# Patient Record
Sex: Female | Born: 1990 | Race: White | Hispanic: No | Marital: Single | Smoking: Former smoker
Health system: Southern US, Community
[De-identification: ages and names within clinical notes are randomized; demographics above are authoritative.]

## PROBLEM LIST (undated history)

## (undated) ENCOUNTER — Inpatient Hospital Stay (HOSPITAL_COMMUNITY): Payer: Medicaid Other

## (undated) DIAGNOSIS — K219 Gastro-esophageal reflux disease without esophagitis: Secondary | ICD-10-CM

## (undated) DIAGNOSIS — D649 Anemia, unspecified: Secondary | ICD-10-CM

## (undated) DIAGNOSIS — F329 Major depressive disorder, single episode, unspecified: Secondary | ICD-10-CM

## (undated) DIAGNOSIS — F32A Depression, unspecified: Secondary | ICD-10-CM

## (undated) DIAGNOSIS — Z3490 Encounter for supervision of normal pregnancy, unspecified, unspecified trimester: Secondary | ICD-10-CM

## (undated) DIAGNOSIS — Z789 Other specified health status: Secondary | ICD-10-CM

---
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
## 2008-01-17 IMAGING — US US OB US >=[ID] SNGL FETUS
1 series · 17 of 28 positions shown · non-contrast
Comparison: none

REASON FOR EXAM: right lower quadrant pain
COMMENTS:

[Series 1: us ob us >=(id) sngl fetus · 17 of 46 slices shown]
[im 1/46]
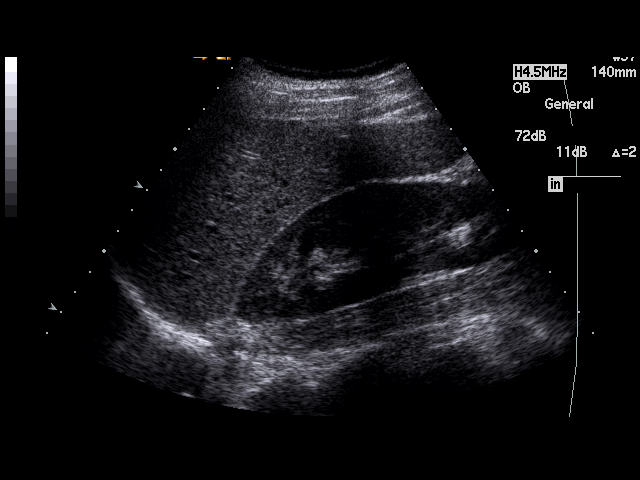
[im 4/46]
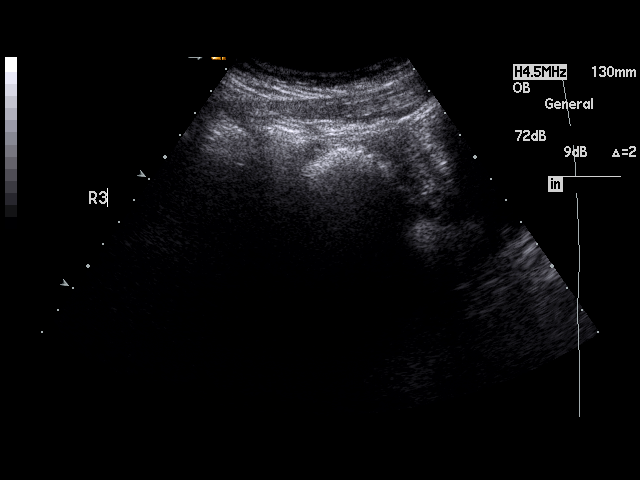
[im 7/46]
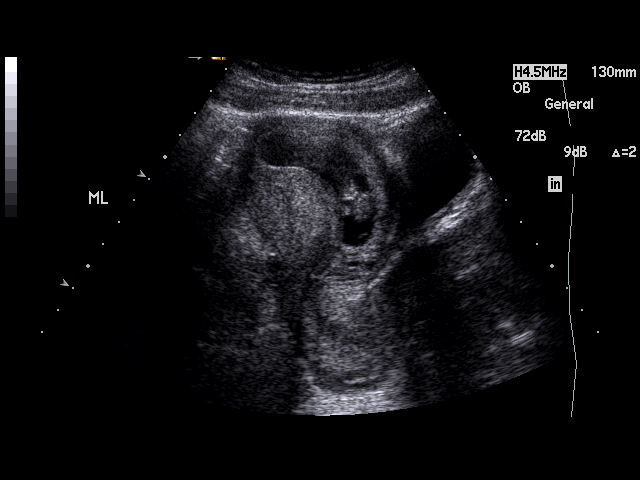
[im 9/46]
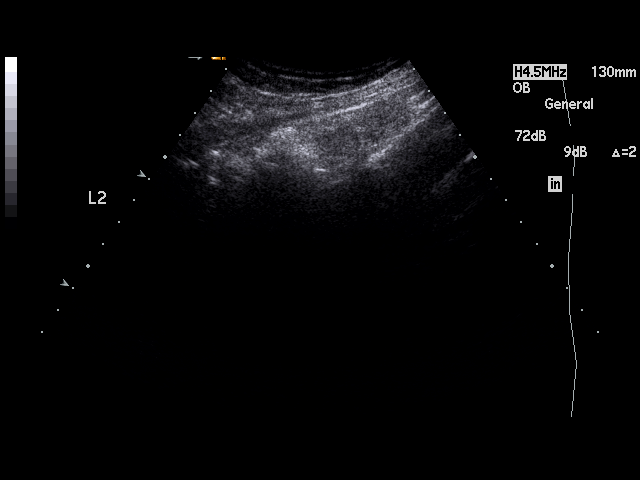
[im 12/46]
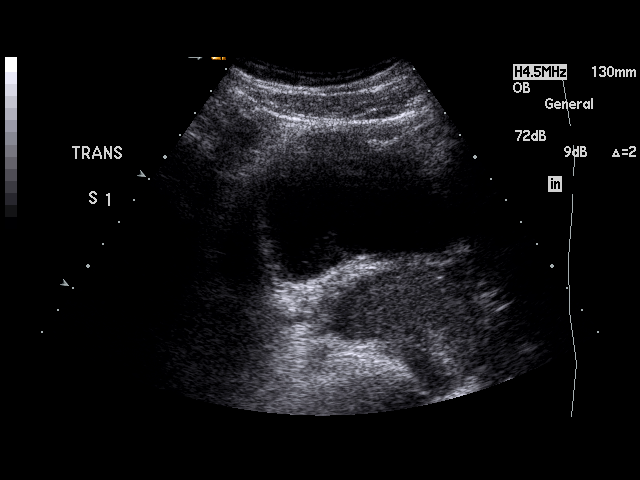
[im 16/46]
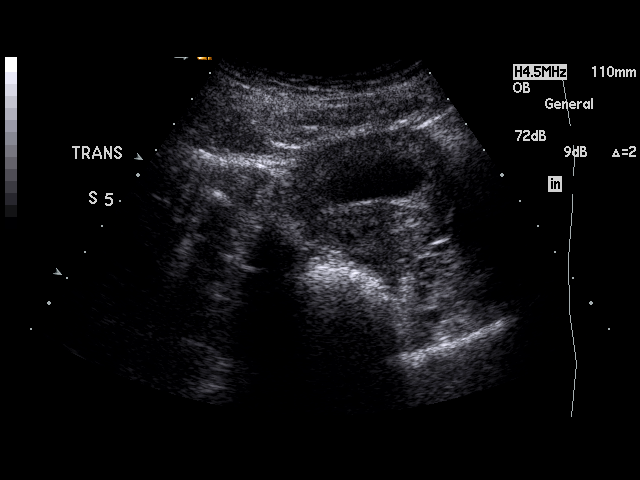
[im 17/46]
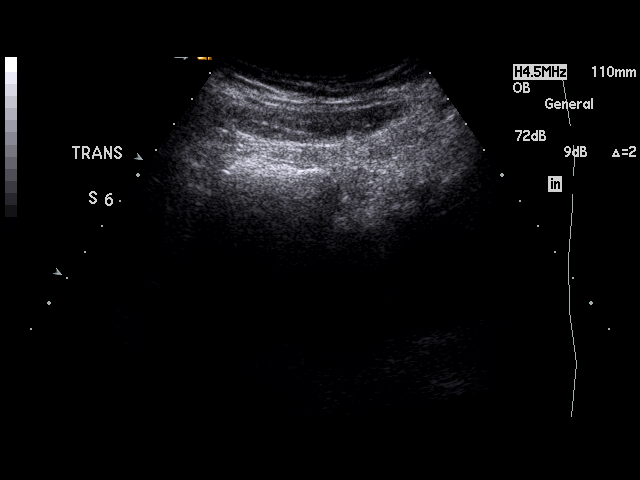
[im 21/46]
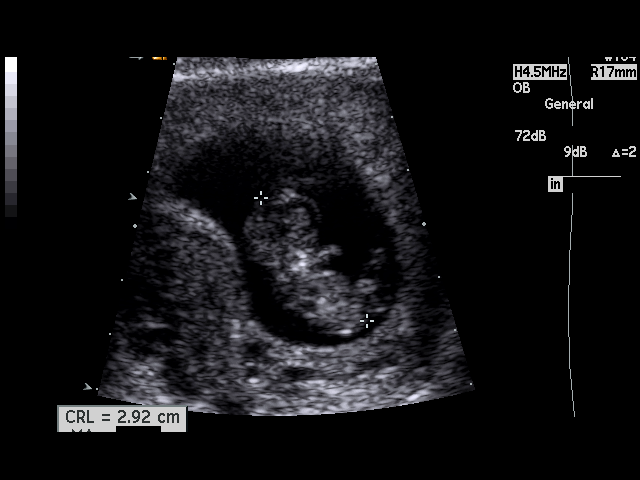
[im 24/46]
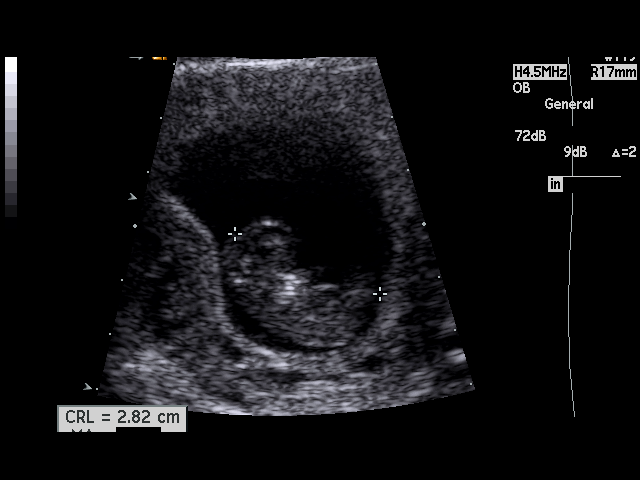
[im 26/46]
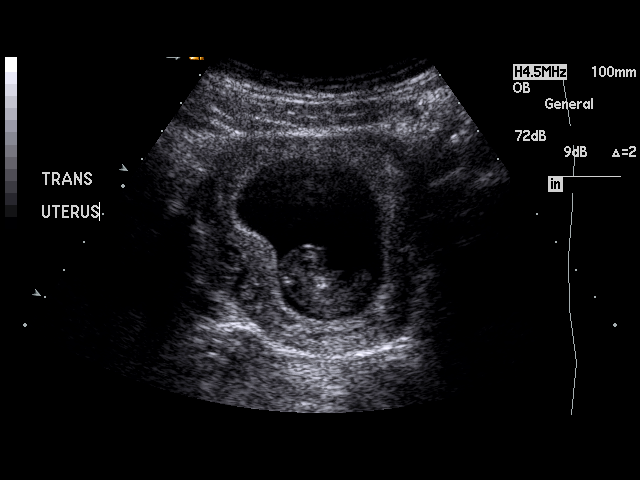
[im 29/46]
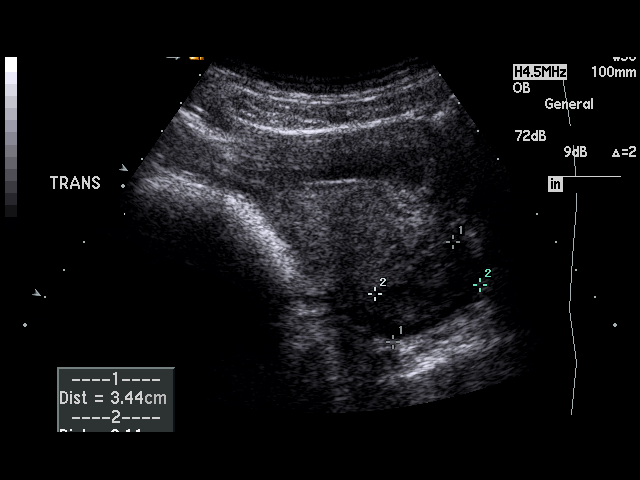
[im 31/46]
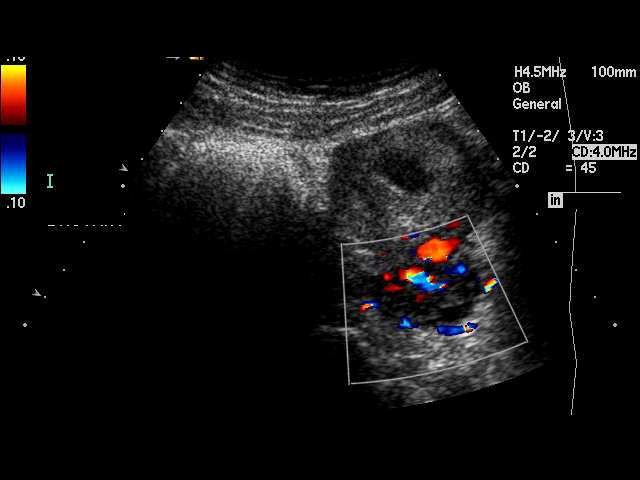
[im 34/46]
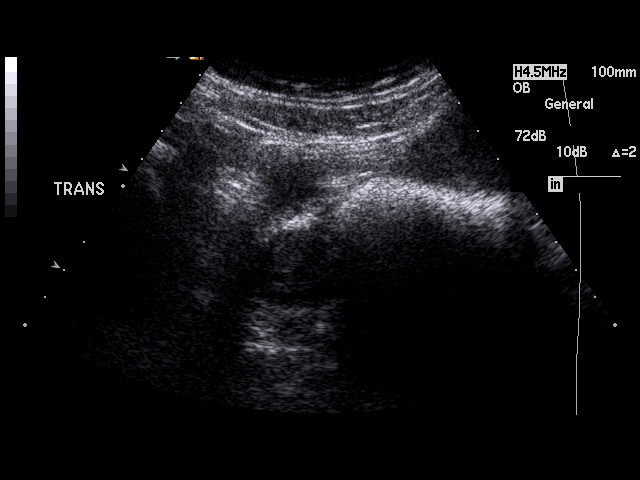
[im 37/46]
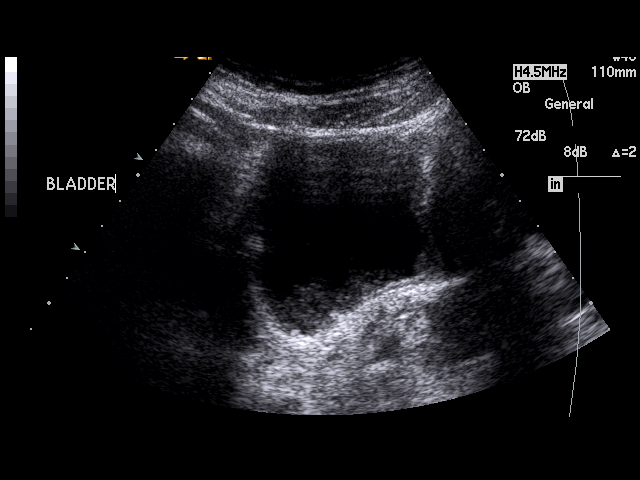
[im 39/46]
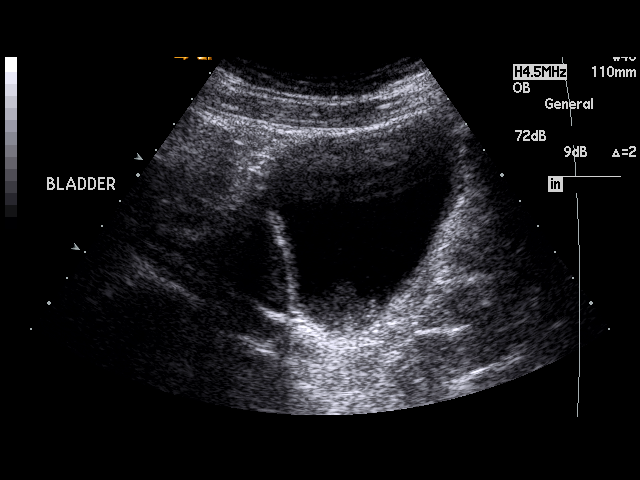
[im 42/46]
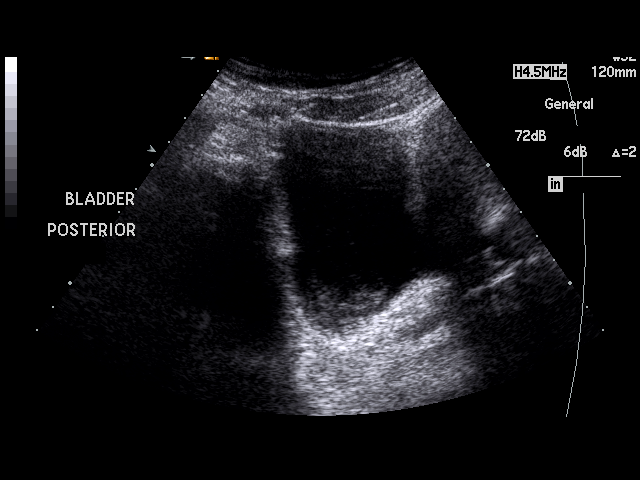
[im 46/46]
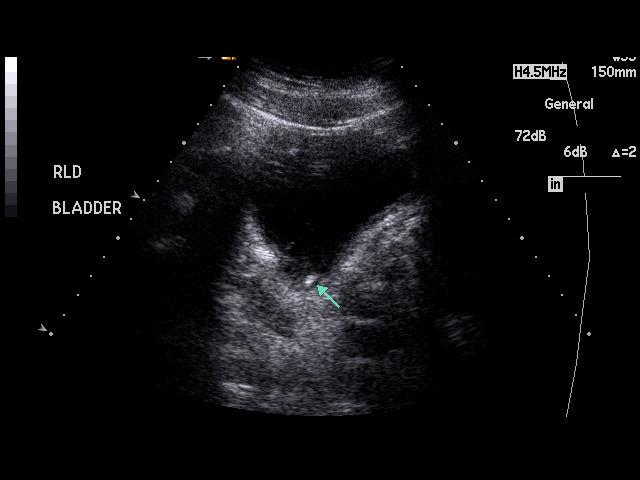

[17 of 28 positions shown; findings below may reference images not displayed]

PROCEDURE:     US  - US OB GREATER/OR EQUAL TO P75H2  - July 12, 2006 [DATE]

RESULT:     A single viable intrauterine pregnancy is appreciated with
estimated fetal heart rate of 171 beats per minute.  Crown rump length is
measured at 2.86 cm and corresponds to an estimated gestational age of 9
weeks-0 days.  The RIGHT ovary demonstrates a homogenous echotexture and
measures 2.94 x 1.79 cm.  The LEFT ovary also demonstrates a homogenous
echotexture and measures 3.4 x 3.1 cm.  There does not appear to be evidence
of pelvic masses, free fluid or drainable loculated fluid collections.

The urinary bladder is partially distended with urine and demonstrates
dependent echogenic material as well as small echogenic foci and projects
along the posterior aspect of the bladder wall.
IMPRESSION: 1)Single viable intrauterine pregnancy with estimated gestational age of 9
weeks-0 days.

2)Echogenic debris within the dependent portion of the urinary bladder may
represent blood or proteinaceous material.  Findings also are suspicious for
small calculi within the bladder. These findings can be further evaluated
with direct visualization if clinically warranted.

Dr. Babikir of the Emergency Room was informed of these findings via a
preliminary faxed report on 07-13-06.

## 2011-12-11 ENCOUNTER — Emergency Department: Payer: Self-pay | Admitting: Emergency Medicine

## 2015-04-24 ENCOUNTER — Encounter: Payer: Self-pay | Admitting: *Deleted

## 2015-04-24 ENCOUNTER — Emergency Department
Admission: EM | Admit: 2015-04-24 | Discharge: 2015-04-24 | Disposition: A | Discharge status: Home / Self Care | Payer: No Typology Code available for payment source | Attending: Emergency Medicine | Admitting: Emergency Medicine

## 2015-04-24 ENCOUNTER — Emergency Department: Payer: No Typology Code available for payment source

## 2015-04-24 DIAGNOSIS — S199XXA Unspecified injury of neck, initial encounter: Secondary | ICD-10-CM | POA: Diagnosis present

## 2015-04-24 DIAGNOSIS — S161XXA Strain of muscle, fascia and tendon at neck level, initial encounter: Secondary | ICD-10-CM | POA: Diagnosis not present

## 2015-04-24 DIAGNOSIS — Y999 Unspecified external cause status: Secondary | ICD-10-CM | POA: Diagnosis not present

## 2015-04-24 DIAGNOSIS — M62838 Other muscle spasm: Secondary | ICD-10-CM

## 2015-04-24 DIAGNOSIS — Y9389 Activity, other specified: Secondary | ICD-10-CM | POA: Insufficient documentation

## 2015-04-24 DIAGNOSIS — Y92410 Unspecified street and highway as the place of occurrence of the external cause: Secondary | ICD-10-CM | POA: Diagnosis not present

## 2015-04-24 MED ORDER — CYCLOBENZAPRINE HCL 5 MG PO TABS: 5.0000 mg | ORAL_TABLET | Freq: Three times a day (TID) | ORAL | Status: DC | PRN

## 2015-04-24 MED ORDER — NAPROXEN 500 MG PO TABS: 500.0000 mg | ORAL_TABLET | Freq: Two times a day (BID) | ORAL | Status: DC
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
## 2015-04-24 NOTE — ED Notes (Signed)
Pt taken to xray 
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
## 2015-04-24 NOTE — Discharge Instructions (Signed)
Cervical Sprain A cervical sprain is when the tissues (ligaments) that hold the neck bones in place stretch or tear. HOME CARE   Put ice on the injured area.  Put ice in a plastic bag.  Place a towel between your skin and the bag.  Leave the ice on for 15-20 minutes, 3-4 times a day.  You may have been given a collar to wear. This collar keeps your neck from moving while you heal.  Do not take the collar off unless told by your doctor.  If you have long hair, keep it outside of the collar.  Ask your doctor before changing the position of your collar. You may need to change its position over time to make it more comfortable.  If you are allowed to take off the collar for cleaning or bathing, follow your doctor's instructions on how to do it safely.  Keep your collar clean by wiping it with mild soap and water. Dry it completely. If the collar has removable pads, remove them every 1-2 days to hand wash them with soap and water. Allow them to air dry. They should be dry before you wear them in the collar.  Do not drive while wearing the collar.  Only take medicine as told by your doctor.  Keep all doctor visits as told.  Keep all physical therapy visits as told.  Adjust your work station so that you have good posture while you work.  Avoid positions and activities that make your problems worse.  Warm up and stretch before being active. GET HELP IF:  Your pain is not controlled with medicine.  You cannot take less pain medicine over time as planned.  Your activity level does not improve as expected. GET HELP RIGHT AWAY IF:   You are bleeding.  Your stomach is upset.  You have an allergic reaction to your medicine.  You develop new problems that you cannot explain.  You lose feeling (become numb) or you cannot move any part of your body (paralysis).  You have tingling or weakness in any part of your body.  Your symptoms get worse. Symptoms include:  Pain,  soreness, stiffness, puffiness (swelling), or a burning feeling in your neck.  Pain when your neck is touched.  Shoulder or upper back pain.  Limited ability to move your neck.  Headache.  Dizziness.  Your hands or arms feel week, lose feeling, or tingle.  Muscle spasms.  Difficulty swallowing or chewing. MAKE SURE YOU:   Understand these instructions.  Will watch your condition.  Will get help right away if you are not doing well or get worse.   This information is not intended to replace advice given to you by your health care provider. Make sure you discuss any questions you have with your health care provider.   Document Released: 06/22/2007 Document Revised: 09/05/2012 Document Reviewed: 07/11/2012 Elsevier Interactive Patient Education 2016 Elsevier Inc.  Cryotherapy Cryotherapy is when you put ice on your injury. Ice helps lessen pain and puffiness (swelling) after an injury. Ice works the best when you start using it in the first 24 to 48 hours after an injury. HOME CARE  Put a dry or damp towel between the ice pack and your skin.  You may press gently on the ice pack.  Leave the ice on for no more than 10 to 20 minutes at a time.  Check your skin after 5 minutes to make sure your skin is okay.  Rest at least 20  minutes between ice pack uses.  Stop using ice when your skin loses feeling (numbness).  Do not use ice on someone who cannot tell you when it hurts. This includes small children and people with memory problems (dementia). GET HELP RIGHT AWAY IF:  You have white spots on your skin.  Your skin turns blue or pale.  Your skin feels waxy or hard.  Your puffiness gets worse. MAKE SURE YOU:   Understand these instructions.  Will watch your condition.  Will get help right away if you are not doing well or get worse.   This information is not intended to replace advice given to you by your health care provider. Make sure you discuss any  questions you have with your health care provider.   Document Released: 06/22/2007 Document Revised: 03/28/2011 Document Reviewed: 08/26/2010 Elsevier Interactive Patient Education 2016 Elsevier Inc.  Foot LockerHeat Therapy Heat therapy can help ease sore, stiff, injured, and tight muscles and joints. Heat relaxes your muscles, which may help ease your pain. Heat therapy should only be used on old, pre-existing, or long-lasting (chronic) injuries. Do not use heat therapy unless told by your doctor. HOW TO USE HEAT THERAPY There are several different kinds of heat therapy, including:  Moist heat pack.  Warm water bath.  Hot water bottle.  Electric heating pad.  Heated gel pack.  Heated wrap.  Electric heating pad. GENERAL HEAT THERAPY RECOMMENDATIONS   Do not sleep while using heat therapy. Only use heat therapy while you are awake.  Your skin may turn pink while using heat therapy. Do not use heat therapy if your skin turns red.  Do not use heat therapy if you have new pain.  High heat or long exposure to heat can cause burns. Be careful when using heat therapy to avoid burning your skin.  Do not use heat therapy on areas of your skin that are already irritated, such as with a rash or sunburn. GET HELP IF:   You have blisters, redness, swelling (puffiness), or numbness.  You have new pain.  Your pain is worse. MAKE SURE YOU:  Understand these instructions.  Will watch your condition.  Will get help right away if you are not doing well or get worse.   This information is not intended to replace advice given to you by your health care provider. Make sure you discuss any questions you have with your health care provider.   Document Released: 03/28/2011 Document Revised: 01/24/2014 Document Reviewed: 02/26/2013 Elsevier Interactive Patient Education 2016 [employer]  Tourist information centre managerMotor Vehicle Collision After a car crash (motor vehicle collision), it is normal to have bruises and sore  muscles. The first 24 hours usually feel the worst. After that, you will likely start to feel better each day. HOME CARE  Put ice on the injured area.  Put ice in a plastic bag.  Place a towel between your skin and the bag.  Leave the ice on for 15-20 minutes, 03-04 times a day.  Drink enough fluids to keep your pee (urine) clear or pale yellow.  Do not drink alcohol.  Take a warm shower or bath 1 or 2 times a day. This helps your sore muscles.  Return to activities as told by your doctor. Be careful when lifting. Lifting can make neck or back pain worse.  Only take medicine as told by your doctor. Do not use aspirin. GET HELP RIGHT AWAY IF:   Your arms or legs tingle, feel weak, or lose feeling (numbness).  You have headaches that do not get better with medicine.  You have neck pain, especially in the middle of the back of your neck.  You cannot control when you pee (urinate) or poop (bowel movement).  Pain is getting worse in any part of your body.  You are short of breath, dizzy, or pass out (faint).  You have chest pain.  You feel sick to your stomach (nauseous), throw up (vomit), or sweat.  You have belly (abdominal) pain that gets worse.  There is blood in your pee, poop, or throw up.  You have pain in your shoulder (shoulder strap areas).  Your problems are getting worse. MAKE SURE YOU:   Understand these instructions.  Will watch your condition.  Will get help right away if you are not doing well or get worse.   This information is not intended to replace advice given to you by your health care provider. Make sure you discuss any questions you have with your health care provider.   Document Released: 06/22/2007 Document Revised: 03/28/2011 Document Reviewed: 06/02/2010 Elsevier Interactive Patient Education [employer] Elsevier Inc.
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
## 2016-04-04 DIAGNOSIS — Z3201 Encounter for pregnancy test, result positive: Secondary | ICD-10-CM | POA: Diagnosis not present

## 2016-04-04 DIAGNOSIS — R112 Nausea with vomiting, unspecified: Secondary | ICD-10-CM | POA: Diagnosis not present

## 2016-04-26 ENCOUNTER — Other Ambulatory Visit: Payer: Self-pay | Admitting: Nurse Practitioner

## 2016-04-26 DIAGNOSIS — Z369 Encounter for antenatal screening, unspecified: Secondary | ICD-10-CM

## 2016-05-09 ENCOUNTER — Ambulatory Visit
Admission: RE | Admit: 2016-05-09 | Discharge: 2016-05-09 | Disposition: A | Discharge status: Home / Self Care | Payer: Medicaid Other | Source: Ambulatory Visit | Attending: Maternal and Fetal Medicine | Admitting: Maternal and Fetal Medicine

## 2016-05-09 VITALS — BP 119/56 | HR 102 | Temp 98.0°F | Resp 18 | Ht 69.5 in | Wt 152.0 lb

## 2016-05-09 DIAGNOSIS — O3482 Maternal care for other abnormalities of pelvic organs, second trimester: Secondary | ICD-10-CM | POA: Insufficient documentation

## 2016-05-09 DIAGNOSIS — Z369 Encounter for antenatal screening, unspecified: Secondary | ICD-10-CM

## 2016-05-09 DIAGNOSIS — Z3A12 12 weeks gestation of pregnancy: Secondary | ICD-10-CM | POA: Diagnosis not present

## 2016-05-09 HISTORY — DX: Other specified health status: Z78.9

## 2016-05-09 NOTE — Progress Notes (Addendum)
Referring physician: [facility] Department Length of Consultation: 40 minutes   Kelli Ramirez  was referred to [facility] for genetic counseling to review prenatal screening and testing options.  This note summarizes the information we discussed.    We offered the following routine screening tests for this pregnancy:  First trimester screening, which includes nuchal translucency ultrasound screen and first trimester maternal serum marker screening.  The nuchal translucency has approximately an 80% detection rate for Down syndrome and can be positive for other chromosome abnormalities as well as congenital heart defects.  When combined with a maternal serum marker screening, the detection rate is up to 90% for Down syndrome and up to 97% for trisomy 18.     Maternal serum marker screening, a blood test that measures pregnancy proteins, can provide risk assessments for Down syndrome, trisomy 18, and open neural tube defects (spina bifida, anencephaly). Because it does not directly examine the fetus, it cannot positively diagnose or rule out these problems.  Targeted ultrasound uses high frequency sound waves to create an image of the developing fetus.  An ultrasound is often recommended as a routine means of evaluating the pregnancy.  It is also used to screen for fetal anatomy problems (for example, a heart defect) that might be suggestive of a chromosomal or other abnormality.   Should these screening tests indicate an increased concern, then the following additional testing options would be offered:  The chorionic villus sampling procedure is available for first trimester chromosome analysis.  This involves the withdrawal of a small amount of chorionic villi (tissue from the developing placenta).  Risk of pregnancy loss is estimated to be approximately 1 in 200 to 1 in 100 (0.5 to 1%).  There is approximately a 1% (1 in 100) chance that the CVS chromosome  results will be unclear.  Chorionic villi cannot be tested for neural tube defects.     Amniocentesis involves the removal of a small amount of amniotic fluid from the sac surrounding the fetus with the use of a thin Ramirez inserted through the maternal abdomen and uterus.  Ultrasound guidance is used throughout the procedure.  Fetal cells from amniotic fluid are directly evaluated and > 99.5% of chromosome problems and > 98% of open neural tube defects can be detected. This procedure is generally performed after the 15th week of pregnancy.  The main risks to this procedure include complications leading to miscarriage in less than 1 in 200 cases (0.5%).  As another option for information if the pregnancy is suspected to be an an increased chance for certain chromosome conditions, we also reviewed the availability of cell free fetal DNA testing from maternal blood to determine whether or not the baby may have either Down syndrome, trisomy 5, or trisomy 45.  This test utilizes a maternal blood sample and DNA sequencing technology to isolate circulating cell free fetal DNA from maternal plasma.  The fetal DNA can then be analyzed for DNA sequences that are derived from the three most common chromosomes involved in aneuploidy, chromosomes 13, 18, and 21.  If the overall amount of DNA is greater than the expected level for any of these chromosomes, aneuploidy is suspected.  While we do not consider it a replacement for invasive testing and karyotype analysis, a negative result from this testing would be reassuring, though not a guarantee of a normal chromosome complement for the baby.  An abnormal result is certainly suggestive of an abnormal chromosome complement,  though we would still recommend CVS or amniocentesis to confirm any findings from this testing.  Cystic Fibrosis and Spinal Muscular Atrophy (SMA) screening were also discussed with the patient. Both conditions are recessive, which means that both  parents must be carriers in order to have a child with the disease.  Cystic fibrosis (CF) is one of the most common genetic conditions in persons of Caucasian ancestry.  This condition occurs in approximately 1 in 2,500 Caucasian persons and results in thickened secretions in the lungs, digestive, and reproductive systems.  For a baby to be at risk for having CF, both of the parents must be carriers for this condition.  Approximately 1 in 33 Caucasian persons is a carrier for CF.  Current carrier testing looks for the most common mutations in the gene for CF and can detect approximately 90% of carriers in the Caucasian population.  This means that the carrier screening can greatly reduce, but cannot eliminate, the chance for an individual to have a child with CF.  If an individual is found to be a carrier for CF, then carrier testing would be available for the partner. As part of [place]'s newborn screening profile, all babies born in the state of [place] will have a two-tier screening process.  Specimens are first tested to determine the concentration of immunoreactive trypsinogen (IRT).  The top 5% of specimens with the highest IRT values then undergo DNA testing using a panel of over 40 common CF mutations. SMA is a neurodegenerative disorder that leads to atrophy of skeletal muscle and overall weakness.  This condition is also more prevalent in the Caucasian population, with 1 in 40-1 in 60 persons being a carrier and 1 in 6,000-1 in 10,000 children being affected.  There are multiple forms of the disease, with some causing death in infancy to other forms with survival into adulthood.  The genetics of SMA is complex, but carrier screening can detect up to 95% of carriers in the Caucasian population.  Similar to CF, a negative result can greatly reduce, but cannot eliminate, the chance to have a child with SMA.  We obtained a detailed family history and pregnancy history.  Kelli Ramirez reported that  the father of the baby, Kelli Ramirez, has a nephew (his brother's son) with autism.  We talked about the fact that the cause for autism spectrum disorders is often not well understood, though there may be both genetic and environmental factors.  Autism may also be part of numerous genetic syndromes.  The most common condition we offer screening for related to this history is Fragile X syndrome, though this condition is inherited from a carrier mother and cannot be passed from father to son.  Because this nephew is related through only males, we did not proceed with Fragile X testing.  The patient also said that the mother of that child has a history of autism in her family.  If more is learned about the cause for the condition in this nephew, we are happy to review this further.  The remainder of the family history was reported to be unremarkable for birth defects, intellectual delays, recurrent pregnancy loss or known chromosome abnormalities.  Kelli Ramirez stated that this is her second pregnancy.  She reported no complications or exposures in this pregnancy that would be expected to increase the risk for birth defects.  After consideration of the options, Kelli Ramirez elected to proceed with first trimester screening and to decline CF and SMA carrier screening.  An ultrasound was performed at the time of the visit.  The gestational age was consistent with 12 weeks.  Fetal anatomy could not be assessed due to early gestational age.  Please refer to the ultrasound report for details of that study.  Kelli Ramirez was encouraged to call with questions or concerns.  We can be contacted at [phone].    Cherly Anderson, MS, CGC  I was immediately available and supervising. Camelia Phenes, MD [facility]

## 2016-05-12 ENCOUNTER — Telehealth: Payer: Self-pay | Admitting: Obstetrics and Gynecology

## 2016-05-12 NOTE — Telephone Encounter (Signed)
Kelli Ramirez  elected to undergo First Trimester screening on 05/09/2016.  To review, first trimester screening, includes nuchal translucency ultrasound screen and/or first trimester maternal serum marker screening.  The nuchal translucency has approximately an 80% detection rate for Down syndrome and can be positive for other chromosome abnormalities as well as heart defects.  When combined with a maternal serum marker screening, the detection rate is up to 90% for Down syndrome and up to 97% for trisomy 13 and 18.     The results of the First Trimester Nuchal Translucency and Biochemical Screening were within normal range.  The risk for Down syndrome is now estimated to be 1 in 3,102.  The risk for Trisomy 13/18 is less than 1 in 10,000.  Should more definitive information be desired, we would offer amniocentesis.  Because we do not yet know the effectiveness of combined first and second trimester screening, we do not recommend a maternal serum screen to assess the chance for chromosome conditions.  However, if screening for neural tube defects is desired, maternal serum screening for AFP only can be performed between 15 and [redacted] weeks gestation.     Kelli Anderson, MS, CGC

## 2016-06-06 ENCOUNTER — Telehealth: Payer: Self-pay | Admitting: Obstetrics and Gynecology

## 2016-06-16 ENCOUNTER — Other Ambulatory Visit: Payer: Self-pay | Admitting: *Deleted

## 2016-06-16 DIAGNOSIS — O99320 Drug use complicating pregnancy, unspecified trimester: Secondary | ICD-10-CM

## 2016-06-20 ENCOUNTER — Ambulatory Visit
Admission: RE | Admit: 2016-06-20 | Discharge: 2016-06-20 | Disposition: A | Discharge status: Home / Self Care | Payer: Medicaid Other | Source: Ambulatory Visit | Attending: Maternal & Fetal Medicine | Admitting: Maternal & Fetal Medicine

## 2016-06-20 DIAGNOSIS — O99320 Drug use complicating pregnancy, unspecified trimester: Secondary | ICD-10-CM

## 2016-10-14 DIAGNOSIS — O34211 Maternal care for low transverse scar from previous cesarean delivery: Secondary | ICD-10-CM | POA: Diagnosis not present

## 2016-10-14 DIAGNOSIS — Z3009 Encounter for other general counseling and advice on contraception: Secondary | ICD-10-CM | POA: Diagnosis not present

## 2016-10-14 NOTE — H&P (Signed)
Kelli Ramirez is a 26 y.o. female here for Rf by Rodman Comp, CNM for delivery plans . Pt prenatal records reiewed + drug use  Cocaine  And MJ . edc 11/17/16. Pt desires permanent BTL , medicaid consent signed   Past Medical History:  has a past medical history of Anemia, unspecified; Anxiety, unspecified; Depression, unspecified; Family history of child sexual abuse; History of cocaine use; and History of marijuana use.  Past Surgical History:  has a past surgical history that includes Cesarean section. Family History: family history is not on file. Social History:  reports that she has never smoked. She has never used smokeless tobacco. She reports that she does not drink alcohol or use drugs. OB/GYN History:          OB History    Gravida Para Term Preterm AB Living   SAB TAB Ectopic Molar Multiple Live Births             1      Allergies: has No Known Allergies. Medications:  Current Outpatient Prescriptions:  .  ferrous sulfate 325 (65 FE) MG tablet, Take 325 mg by mouth daily with breakfast., Disp: , Rfl:  .  prenatal vit-iron fum-folic ac (PRENAVITE) tablet, Take 1 tablet by mouth once daily., Disp: , Rfl:  .  sertraline (ZOLOFT) 50 MG tablet, Take 50 mg by mouth once daily., Disp: , Rfl:   Review of Systems: General:                      No fatigue or weight loss Eyes:                           No vision changes Ears:                            No hearing difficulty Respiratory:                No cough or shortness of breath Pulmonary:                  No asthma or shortness of breath Cardiovascular:           No chest pain, palpitations, dyspnea on exertion Gastrointestinal:          No abdominal bloating, chronic diarrhea, constipations, masses, pain or hematochezia Genitourinary:             No hematuria, dysuria, abnormal vaginal discharge, pelvic pain, Menometrorrhagia Lymphatic:                   No swollen lymph nodes Musculoskeletal:          No muscle weakness Neurologic:                  No extremity weakness, syncope, seizure disorder Psychiatric:                  No history of depression, delusions or suicidal/homicidal ideation    Exam:   Vitals:   10/14/16 1006  BP: 110/65  Pulse: 97    Body mass index is 23.96 kg/m.  WDWN white/  female in NAD   Lungs: CTA  CV : RRR without murmur   Neck:  no thyromegaly Abdomen: soft , no mass, normal active bowel sounds,  non-tender, no rebound tenderness, well healed pfannenstiel  incision    Impression:   The primary encounter diagnosis was Previous cesarean delivery affecting pregnancy. A diagnosis of General counseling and advice for contraceptive management was also pertinent to this visit.    Plan:   Schedule repeat LTCS + BTL on 11/10/16 Benefits and risks to surgery: The proposed benefit of the surgery has been discussed with the patient. The possible risks include, but are not limited to: organ injury to the bowel , bladder, ureters, and major blood vessels and nerves. There is a possibility of additional surgeries resulting from these injuries. There is also the risk of blood transfusion and the need to receive blood products during or after the procedure which may rarely lead to HIV or Hepatitis C infection. There is a risk of developing a deep venous thrombosis or a pulmonary embolism . There is the possibility of wound infection and also anesthetic complications, even the rare possibility of death. The patient understands these risks and wishes to proceed. All questions have been answered and the consent has been signed.    Return if symptoms worsen or fail to improve.  Vilma Prader, MD  10/14/16

## 2016-11-09 ENCOUNTER — Encounter
Admission: RE | Admit: 2016-11-09 | Discharge: 2016-11-09 | Disposition: A | Discharge status: Home / Self Care | Payer: Medicaid Other | Source: Ambulatory Visit | Attending: Obstetrics and Gynecology | Admitting: Obstetrics and Gynecology

## 2016-11-09 HISTORY — DX: Encounter for supervision of normal pregnancy, unspecified, unspecified trimester: Z34.90

## 2016-11-09 HISTORY — DX: Depression, unspecified: F32.A

## 2016-11-09 HISTORY — DX: Gastro-esophageal reflux disease without esophagitis: K21.9

## 2016-11-09 HISTORY — DX: Major depressive disorder, single episode, unspecified: F32.9

## 2016-11-09 HISTORY — DX: Anemia, unspecified: D64.9

## 2016-11-09 LAB — BASIC METABOLIC PANEL
Anion gap: 6 (ref 5–15)
BUN: 9 mg/dL (ref 6–20)
CHLORIDE: 106 mmol/L (ref 101–111)
CO2: 25 mmol/L (ref 22–32)
Calcium: 8.8 mg/dL — ABNORMAL LOW (ref 8.9–10.3)
Creatinine, Ser: 0.7 mg/dL (ref 0.44–1.00)
GFR calc Af Amer: >60 mL/min (ref >60)
GFR calc non Af Amer: >60 mL/min (ref >60)
GLUCOSE: 96 mg/dL (ref 65–99)
POTASSIUM: 3.5 mmol/L (ref 3.5–5.1)
Sodium: 137 mmol/L (ref 135–145)

## 2016-11-09 LAB — CBC
HEMATOCRIT: 31.2 % — AB (ref 35.0–47.0)
Hemoglobin: 11 g/dL — ABNORMAL LOW (ref 12.0–16.0)
MCH: 33.2 pg (ref 26.0–34.0)
MCHC: 35.3 g/dL (ref 32.0–36.0)
MCV: 94.1 fL (ref 80.0–100.0)
PLATELETS: 171 10*3/uL (ref 150–440)
RBC: 3.32 MIL/uL — ABNORMAL LOW (ref 3.80–5.20)
RDW: 13.2 % (ref 11.5–14.5)
WBC: 8.3 10*3/uL (ref 3.6–11.0)

## 2016-11-09 LAB — TYPE AND SCREEN
ABO/RH(D): O POS
Antibody Screen: NEGATIVE
EXTEND SAMPLE REASON: UNDETERMINED

## 2016-11-09 NOTE — Pre-Procedure Instructions (Signed)
Patient was unaware of the time change for her surgery. She will need to get her transportation rearranged for earlier morning.

## 2016-11-09 NOTE — Patient Instructions (Addendum)
PRE OPERATIVE INSTRUCTIONS FOR C-SECTION:  Nothing to eat after midnight tonight.  This includes gum, candy, lozengers.  You may drink clear liquids up until 2 hours before your scheduled arrival time.  Use the Chlorhexadine soap wash tonight and tomorrow morning. Refer to the sheet, "PREPARING FOR SURGERY".      Use as directed and explained by Pre-Admit Testing Nurse.   No jewelry, no valuables, no need for license, credit card or insurance documents.  Do not use creams or lotions once shower with soap.    Bring comfortable clothing/pajamas to wear in the hospital.    If your water breaks tonight, cramping becomes regular contractions or if bleeding begins, then call Dr. Feliberto GottronSchermerhorn or come to the Emergency Room.   ARRIVE AT THE BIRTHPLACE AT 8:00AM FOR SURGERY AT 10:00AM

## 2016-11-10 ENCOUNTER — Inpatient Hospital Stay: Payer: Medicaid Other | Admitting: Certified Registered Nurse Anesthetist

## 2016-11-10 ENCOUNTER — Encounter
Admission: RE | Disposition: A | Discharge status: Home / Self Care | Payer: Self-pay | Source: Ambulatory Visit | Attending: Obstetrics and Gynecology

## 2016-11-10 ENCOUNTER — Inpatient Hospital Stay
Admission: RE | Admit: 2016-11-10 | Discharge: 2016-11-13 | DRG: 784 | Disposition: A | Discharge status: Home / Self Care | Payer: Medicaid Other | Source: Ambulatory Visit | Attending: Obstetrics and Gynecology | Admitting: Obstetrics and Gynecology

## 2016-11-10 DIAGNOSIS — O9081 Anemia of the puerperium: Secondary | ICD-10-CM | POA: Diagnosis not present

## 2016-11-10 DIAGNOSIS — K219 Gastro-esophageal reflux disease without esophagitis: Secondary | ICD-10-CM | POA: Diagnosis present

## 2016-11-10 DIAGNOSIS — O9962 Diseases of the digestive system complicating childbirth: Secondary | ICD-10-CM | POA: Diagnosis present

## 2016-11-10 DIAGNOSIS — Z3A39 39 weeks gestation of pregnancy: Secondary | ICD-10-CM

## 2016-11-10 DIAGNOSIS — F1290 Cannabis use, unspecified, uncomplicated: Secondary | ICD-10-CM | POA: Diagnosis present

## 2016-11-10 DIAGNOSIS — F419 Anxiety disorder, unspecified: Secondary | ICD-10-CM | POA: Diagnosis present

## 2016-11-10 DIAGNOSIS — O34211 Maternal care for low transverse scar from previous cesarean delivery: Principal | ICD-10-CM | POA: Diagnosis present

## 2016-11-10 DIAGNOSIS — O99344 Other mental disorders complicating childbirth: Secondary | ICD-10-CM | POA: Diagnosis present

## 2016-11-10 DIAGNOSIS — F329 Major depressive disorder, single episode, unspecified: Secondary | ICD-10-CM | POA: Diagnosis present

## 2016-11-10 DIAGNOSIS — F1490 Cocaine use, unspecified, uncomplicated: Secondary | ICD-10-CM | POA: Diagnosis present

## 2016-11-10 DIAGNOSIS — D62 Acute posthemorrhagic anemia: Secondary | ICD-10-CM | POA: Diagnosis not present

## 2016-11-10 DIAGNOSIS — O99324 Drug use complicating childbirth: Secondary | ICD-10-CM | POA: Diagnosis present

## 2016-11-10 DIAGNOSIS — Z302 Encounter for sterilization: Secondary | ICD-10-CM

## 2016-11-10 DIAGNOSIS — Z9889 Other specified postprocedural states: Secondary | ICD-10-CM

## 2016-11-10 LAB — URINE DRUG SCREEN, QUALITATIVE (ARMC ONLY)
Amphetamines, Ur Screen: NOT DETECTED
BARBITURATES, UR SCREEN: NOT DETECTED
Benzodiazepine, Ur Scrn: NOT DETECTED
CANNABINOID 50 NG, UR ~~LOC~~: NOT DETECTED
Cocaine Metabolite,Ur ~~LOC~~: NOT DETECTED
MDMA (ECSTASY) UR SCREEN: NOT DETECTED
Methadone Scn, Ur: NOT DETECTED
Opiate, Ur Screen: NOT DETECTED
Phencyclidine (PCP) Ur S: NOT DETECTED
TRICYCLIC, UR SCREEN: NOT DETECTED

## 2016-11-10 LAB — ABO/RH: ABO/RH(D): O POS

## 2016-11-10 SURGERY — Surgical Case
Anesthesia: Spinal | Laterality: Bilateral

## 2016-11-10 MED ORDER — DIPHENHYDRAMINE HCL 25 MG PO CAPS: 25.0000 mg | ORAL_CAPSULE | Freq: Four times a day (QID) | ORAL | Status: DC | PRN

## 2016-11-10 MED ORDER — SENNOSIDES-DOCUSATE SODIUM 8.6-50 MG PO TABS
2.0000 | ORAL_TABLET | ORAL | Status: DC
  Administered 2016-11-10 – 2016-11-13 (×3): 2 via ORAL
  Filled 2016-11-10 (×4): qty 2

## 2016-11-10 MED ORDER — DIBUCAINE 1 % RE OINT: 1.0000 "application " | TOPICAL_OINTMENT | RECTAL | Status: DC | PRN

## 2016-11-10 MED ORDER — SODIUM CHLORIDE FLUSH 0.9 % IV SOLN
INTRAVENOUS | Status: AC
  Filled 2016-11-10: qty 10

## 2016-11-10 MED ORDER — OXYCODONE-ACETAMINOPHEN 5-325 MG PO TABS
2.0000 | ORAL_TABLET | ORAL | Status: DC | PRN
  Administered 2016-11-11 – 2016-11-13 (×4): 2 via ORAL
  Filled 2016-11-10 (×5): qty 2

## 2016-11-10 MED ORDER — PRENATAL MULTIVITAMIN CH
1.0000 | ORAL_TABLET | Freq: Every day | ORAL | Status: DC
  Administered 2016-11-11 – 2016-11-13 (×3): 1 via ORAL
  Filled 2016-11-10 (×3): qty 1

## 2016-11-10 MED ORDER — TETANUS-DIPHTH-ACELL PERTUSSIS 5-2.5-18.5 LF-MCG/0.5 IM SUSP: 0.5000 mL | Freq: Once | INTRAMUSCULAR | Status: DC

## 2016-11-10 MED ORDER — MORPHINE SULFATE (PF) 0.5 MG/ML IJ SOLN
INTRAMUSCULAR | Status: AC
  Filled 2016-11-10: qty 10

## 2016-11-10 MED ORDER — CEFAZOLIN SODIUM-DEXTROSE 2-4 GM/100ML-% IV SOLN: 2.0000 g | INTRAVENOUS | Status: DC

## 2016-11-10 MED ORDER — DEXTROSE 5 % IV SOLN
2.0000 g | Freq: Once | INTRAVENOUS | Status: AC
  Administered 2016-11-10: 2 g via INTRAVENOUS
  Filled 2016-11-10: qty 2000

## 2016-11-10 MED ORDER — OXYTOCIN 40 UNITS IN LACTATED RINGERS INFUSION - SIMPLE MED
2.5000 [IU]/h | INTRAVENOUS | Status: AC
  Administered 2016-11-10: 2.5 [IU]/h via INTRAVENOUS
  Filled 2016-11-10 (×2): qty 1000

## 2016-11-10 MED ORDER — SODIUM CHLORIDE 0.9 % IJ SOLN
INTRAMUSCULAR | Status: AC
  Filled 2016-11-10: qty 50

## 2016-11-10 MED ORDER — OXYTOCIN 40 UNITS IN LACTATED RINGERS INFUSION - SIMPLE MED
INTRAVENOUS | Status: DC | PRN
  Administered 2016-11-10: 800 mL via INTRAVENOUS

## 2016-11-10 MED ORDER — PROMETHAZINE HCL 25 MG/ML IJ SOLN
INTRAMUSCULAR | Status: AC
  Filled 2016-11-10: qty 1

## 2016-11-10 MED ORDER — OXYCODONE-ACETAMINOPHEN 5-325 MG PO TABS
1.0000 | ORAL_TABLET | ORAL | Status: DC | PRN
  Administered 2016-11-13: 1 via ORAL
  Filled 2016-11-10: qty 1

## 2016-11-10 MED ORDER — EPHEDRINE SULFATE 50 MG/ML IJ SOLN
INTRAMUSCULAR | Status: DC | PRN
  Administered 2016-11-10: 10 mg via INTRAVENOUS
  Administered 2016-11-10 (×5): 5 mg via INTRAVENOUS

## 2016-11-10 MED ORDER — LACTATED RINGERS IV SOLN: INTRAVENOUS | Status: DC

## 2016-11-10 MED ORDER — SIMETHICONE 80 MG PO CHEW: 80.0000 mg | CHEWABLE_TABLET | ORAL | Status: DC | PRN

## 2016-11-10 MED ORDER — WITCH HAZEL-GLYCERIN EX PADS: 1.0000 "application " | MEDICATED_PAD | CUTANEOUS | Status: DC | PRN

## 2016-11-10 MED ORDER — MENTHOL 3 MG MT LOZG
1.0000 | LOZENGE | OROMUCOSAL | Status: DC | PRN
  Filled 2016-11-10: qty 9

## 2016-11-10 MED ORDER — BUPIVACAINE LIPOSOME 1.3 % IJ SUSP: 20.0000 mL | Freq: Once | INTRAMUSCULAR | Status: DC

## 2016-11-10 MED ORDER — SIMETHICONE 80 MG PO CHEW
80.0000 mg | CHEWABLE_TABLET | ORAL | Status: DC
  Administered 2016-11-10 – 2016-11-13 (×3): 80 mg via ORAL
  Filled 2016-11-10 (×3): qty 1

## 2016-11-10 MED ORDER — BUPIVACAINE HCL (PF) 0.5 % IJ SOLN
INTRAMUSCULAR | Status: AC
  Filled 2016-11-10: qty 30

## 2016-11-10 MED ORDER — ONDANSETRON HCL 4 MG/2ML IJ SOLN
4.0000 mg | Freq: Three times a day (TID) | INTRAMUSCULAR | Status: DC | PRN
  Administered 2016-11-10: 4 mg via INTRAVENOUS
  Filled 2016-11-10: qty 2

## 2016-11-10 MED ORDER — FENTANYL CITRATE (PF) 100 MCG/2ML IJ SOLN
INTRAMUSCULAR | Status: AC
  Filled 2016-11-10: qty 2

## 2016-11-10 MED ORDER — DEXAMETHASONE SODIUM PHOSPHATE 10 MG/ML IJ SOLN
10.0000 mg | Freq: Once | INTRAMUSCULAR | Status: DC | PRN
  Filled 2016-11-10: qty 1

## 2016-11-10 MED ORDER — LACTATED RINGERS IV BOLUS (SEPSIS)
1000.0000 mL | Freq: Once | INTRAVENOUS | Status: AC
  Administered 2016-11-10: 1000 mL via INTRAVENOUS

## 2016-11-10 MED ORDER — BUPIVACAINE LIPOSOME 1.3 % IJ SUSP
INTRAMUSCULAR | Status: DC | PRN
  Administered 2016-11-10: 90 mL

## 2016-11-10 MED ORDER — PROMETHAZINE HCL 25 MG/ML IJ SOLN
25.0000 mg | Freq: Three times a day (TID) | INTRAMUSCULAR | Status: DC | PRN
  Filled 2016-11-10: qty 1

## 2016-11-10 MED ORDER — PROMETHAZINE HCL 25 MG/ML IJ SOLN
25.0000 mg | Freq: Three times a day (TID) | INTRAMUSCULAR | Status: DC | PRN
  Administered 2016-11-10: 25 mg via INTRAMUSCULAR

## 2016-11-10 MED ORDER — SODIUM CHLORIDE 0.9 % IV SOLN
INTRAVENOUS | Status: DC | PRN
  Administered 2016-11-10: 35 ug/min via INTRAVENOUS

## 2016-11-10 MED ORDER — SOD CITRATE-CITRIC ACID 500-334 MG/5ML PO SOLN
ORAL | Status: AC
  Administered 2016-11-10: 30 mL
  Filled 2016-11-10: qty 15

## 2016-11-10 MED ORDER — BUPIVACAINE IN DEXTROSE 0.75-8.25 % IT SOLN
INTRATHECAL | Status: DC | PRN
  Administered 2016-11-10: 1.6 mL via INTRATHECAL

## 2016-11-10 MED ORDER — NALBUPHINE HCL 10 MG/ML IJ SOLN: 5.0000 mg | Freq: Four times a day (QID) | INTRAMUSCULAR | Status: DC | PRN

## 2016-11-10 MED ORDER — OXYTOCIN 40 UNITS IN LACTATED RINGERS INFUSION - SIMPLE MED
INTRAVENOUS | Status: AC
  Filled 2016-11-10: qty 1000

## 2016-11-10 MED ORDER — FENTANYL CITRATE (PF) 100 MCG/2ML IJ SOLN
INTRAMUSCULAR | Status: DC | PRN
  Administered 2016-11-10: 20 ug via INTRATHECAL

## 2016-11-10 MED ORDER — COCONUT OIL OIL: 1.0000 "application " | TOPICAL_OIL | Status: DC | PRN

## 2016-11-10 MED ORDER — LACTATED RINGERS IV SOLN
INTRAVENOUS | Status: DC
  Administered 2016-11-10: 09:00:00 via INTRAVENOUS

## 2016-11-10 MED ORDER — ZOLPIDEM TARTRATE 5 MG PO TABS: 5.0000 mg | ORAL_TABLET | Freq: Every evening | ORAL | Status: DC | PRN

## 2016-11-10 MED ORDER — IBUPROFEN 600 MG PO TABS
600.0000 mg | ORAL_TABLET | Freq: Four times a day (QID) | ORAL | Status: DC
  Administered 2016-11-10 – 2016-11-13 (×11): 600 mg via ORAL
  Filled 2016-11-10 (×11): qty 1

## 2016-11-10 MED ORDER — NALBUPHINE HCL 10 MG/ML IJ SOLN
5.0000 mg | Freq: Four times a day (QID) | INTRAMUSCULAR | Status: DC | PRN
  Administered 2016-11-11: 5 mg via INTRAVENOUS
  Filled 2016-11-10: qty 1

## 2016-11-10 MED ORDER — PROMETHAZINE HCL 25 MG/ML IJ SOLN
12.5000 mg | INTRAMUSCULAR | Status: AC
  Administered 2016-11-10: 12.5 mg via INTRAVENOUS

## 2016-11-10 MED ORDER — LACTATED RINGERS IV BOLUS (SEPSIS)
500.0000 mL | Freq: Once | INTRAVENOUS | Status: AC
  Administered 2016-11-10: 500 mL via INTRAVENOUS

## 2016-11-10 MED ORDER — MORPHINE SULFATE (PF) 0.5 MG/ML IJ SOLN
INTRAMUSCULAR | Status: DC | PRN
  Administered 2016-11-10: .2 mg via INTRATHECAL

## 2016-11-10 MED ORDER — SIMETHICONE 80 MG PO CHEW
80.0000 mg | CHEWABLE_TABLET | Freq: Three times a day (TID) | ORAL | Status: DC
  Administered 2016-11-11 – 2016-11-13 (×8): 80 mg via ORAL
  Filled 2016-11-10 (×8): qty 1

## 2016-11-10 MED ORDER — BUPIVACAINE LIPOSOME 1.3 % IJ SUSP
INTRAMUSCULAR | Status: AC
  Filled 2016-11-10: qty 20

## 2016-11-10 MED ORDER — ONDANSETRON HCL 4 MG/2ML IJ SOLN
INTRAMUSCULAR | Status: DC | PRN
  Administered 2016-11-10: 4 mg via INTRAVENOUS

## 2016-11-10 MED ORDER — ACETAMINOPHEN 325 MG PO TABS
650.0000 mg | ORAL_TABLET | ORAL | Status: DC | PRN
  Administered 2016-11-11: 650 mg via ORAL
  Filled 2016-11-10: qty 2

## 2016-11-10 SURGICAL SUPPLY — 23 items
BARRIER ADHS 3X4 INTERCEED (GAUZE/BANDAGES/DRESSINGS) ×2 IMPLANT
CANISTER SUCT 3000ML PPV (MISCELLANEOUS) ×2 IMPLANT
CHLORAPREP W/TINT 26ML (MISCELLANEOUS) ×2 IMPLANT
DRSG TELFA 3X8 NADH (GAUZE/BANDAGES/DRESSINGS) ×2 IMPLANT
ELECT CAUTERY BLADE 6.4 (BLADE) ×2 IMPLANT
ELECT REM PT RETURN 9FT ADLT (ELECTROSURGICAL) ×2
ELECTRODE REM PT RTRN 9FT ADLT (ELECTROSURGICAL) ×1 IMPLANT
GAUZE SPONGE 4X4 12PLY STRL (GAUZE/BANDAGES/DRESSINGS) ×2 IMPLANT
GLOVE BIO SURGEON STRL SZ8 (GLOVE) ×2 IMPLANT
GOWN STRL REUS W/ TWL LRG LVL3 (GOWN DISPOSABLE) ×2 IMPLANT
GOWN STRL REUS W/ TWL XL LVL3 (GOWN DISPOSABLE) ×1 IMPLANT
GOWN STRL REUS W/TWL LRG LVL3 (GOWN DISPOSABLE) ×2
GOWN STRL REUS W/TWL XL LVL3 (GOWN DISPOSABLE) ×1
NS IRRIG 1000ML POUR BTL (IV SOLUTION) ×2 IMPLANT
PACK C SECTION AR (MISCELLANEOUS) ×2 IMPLANT
PAD OB MATERNITY 4.3X12.25 (PERSONAL CARE ITEMS) ×2 IMPLANT
PAD PREP 24X41 OB/GYN DISP (PERSONAL CARE ITEMS) ×2 IMPLANT
STAPLER INSORB 30 2030 C-SECTI (MISCELLANEOUS) ×2 IMPLANT
STRAP SAFETY BODY (MISCELLANEOUS) ×2 IMPLANT
SUCT VACUUM KIWI BELL (SUCTIONS) ×2 IMPLANT
SUT CHROMIC 1 CTX 36 (SUTURE) ×6 IMPLANT
SUT PLAIN GUT 0 (SUTURE) ×4 IMPLANT
SUT VIC AB 0 CT1 36 (SUTURE) ×4 IMPLANT

## 2016-11-10 NOTE — Anesthesia Procedure Notes (Signed)
Procedure Name: MAC Performed by: Demetrius Charity Pre-anesthesia Checklist: Patient identified, Emergency Drugs available, Patient being monitored, Suction available and Timeout performed Oxygen Delivery Method: Nasal cannula

## 2016-11-10 NOTE — Op Note (Signed)
NAMEMarland Kitchen  SANDER, SPECKMAN NO.:  [id number]  MEDICAL RECORD NO.:  [id number]  LOCATION:                                 FACILITY:  PHYSICIAN:  Jennell Corner, MD     DATE OF BIRTH:  DATE OF PROCEDURE:  11/10/2016 DATE OF DISCHARGE:                              OPERATIVE REPORT   PREOPERATIVE DIAGNOSIS: 1. A 39+0 weeks' estimated gestational age. 2. Previous cesarean section, electing for repeat cesarean section. 3. Elective permanent sterilization.  POSTOPERATIVE DIAGNOSIS: 1. A 39+0 weeks' estimated gestational age. 2. Previous cesarean section, electing for repeat cesarean section. 3. Elective permanent sterilization.  PROCEDURE PERFORMED: 1. Repeat low transverse cesarean section. 2. Bilateral tubal ligation, [place].  SURGEON:  Jennell Corner, MD  ANESTHESIA:  Spinal.  FIRST ASSISTANT:  Scrub tech.  INDICATIONS:  A 26 year old gravida 2, para 1 patient with a previous cesarean section.  The patient is decided on repeat cesarean section and permanent sterilization.  Medicaid consent signed.  The patient reconfirms desire to have her sterilization the day of the procedure.  DESCRIPTION OF PROCEDURE:  After adequate spinal anesthesia, the patient was placed in dorsal supine position with hip rolled on the right side. The patient's abdomen was prepped and draped in normal sterile fashion. The patient did receive 2 g IV Ancef prior to commencement of the case. Time-out was performed.  A Pfannenstiel incision was made 2 fingerbreadths above the symphysis pubis.  Sharp dissection was used to identify the fascia.  Fascia was opened in the midline and opened in a transverse fashion.  The superior aspect of the fascia was grasped with Kocher clamps and the recti muscles were dissected free.  Inferior aspect of the fascia was grasped with Kocher clamps and pyramidalis muscles dissected free.  Entry into the peritoneal cavity was accomplished sharply.   The vesicouterine peritoneal fold was identified and opened and bladder flap was created, and the bladder was reflected inferiorly.  Low transverse uterine incision was made upon entry into the endometrial cavity.  Clear fluid resulted.  Fetal head was brought to the incision and a kiwi vacuum was applied to the occiput with 1 gentle pull, the head was delivered.  The vacuum was removed and the shoulders and the body were delivered without difficulty.  Vigorous female was dried on the abdomen for the next 60 seconds with a delayed cord clamping at 60 seconds.  Apgars were assigned by neonatal staff assigned Apgars of 9 and 9.  Weight 6 pounds 9 ounces.  Time of birth is 11:13 a.m. on November 10, 2016.  The placenta was manually delivered. The uterus was exteriorized and the endometrial cavity was wiped clean with laparotomy tape.  Ring forceps was used to open the cervix and this was passed off the operative field.  Uterine incision was closed with 1 chromic suture in a running locking fashion.  Good approximation of edges.  Good hemostasis noted.  Attention was directed to the patient's right fallopian tube, which was grasped with the midportion.  Two separate 0 plain gut sutures were applied to the fallopian tube and a 1.5 cm portion of fallopian tube was removed, good hemostasis was  noted. Similar procedure was repeated on the patient's left fallopian tube. Again, 2-0 plain gut sutures were placed and a 1.5 cm portion of fallopian tube removed.  Posterior cul-de-sac was irrigated and suctioned and the uterus was placed back in the abdominal cavity.  The pericolic gutters were wiped clean with laparotomy tapes.  Uterine incision appeared hemostatic and Interceed was placed over the uterine incision in a T-shaped fashion.  The fascia was then closed with 0 Vicryl suture in a running nonlocking fashion, good approximation of edges.  The fascial edge was then injected with a solution of 20  mL of 1.3% bupivacaine and 30 mL of 0.5% Marcaine and 50 mL of saline, 60 mL were used to inject the fascial edge.  Subcutaneous tissues were irrigated and bovied for hemostasis and the skin was reapproximated with Insorb absorbable staples.  Additional 30 mL of Exparel solution was injected in the skin edges.  There were no complications.  ESTIMATED BLOOD LOSS:  500 mL.  INTRAOPERATIVE FLUIDS:  1490 mL of Exparel solution used.  The patient tolerated the procedure well and was taken to recovery room in good condition.    ______________________________ Jennell Cornerhomas Schermerhorn, MD   ______________________________ Jennell Cornerhomas Schermerhorn, MD    TS/MEDQ  D:  11/10/2016  T:  11/10/2016  Job:  [id number]

## 2016-11-10 NOTE — Anesthesia Preprocedure Evaluation (Signed)
Anesthesia Evaluation  Patient identified by MRN, date of birth, ID band Patient awake    Reviewed: Allergy & Precautions, H&P , NPO status , Patient's Chart, lab work & pertinent test results, reviewed documented beta blocker date and time   Airway Mallampati: II   Neck ROM: full    Dental  (+) Poor Dentition   Pulmonary neg pulmonary ROS, former smoker,    Pulmonary exam normal        Cardiovascular negative cardio ROS Normal cardiovascular exam Rhythm:regular Rate:Normal     Neuro/Psych negative neurological ROS  negative psych ROS   GI/Hepatic negative GI ROS, Neg liver ROS, GERD  Medicated,  Endo/Other  negative endocrine ROS  Renal/GU negative Renal ROS  negative genitourinary   Musculoskeletal   Abdominal   Peds  Hematology negative hematology ROS (+) anemia ,   Anesthesia Other Findings Past Medical History: No date: Anemia     Comment:  takes iron supplements No date: Depression No date: GERD (gastroesophageal reflux disease)     Comment:  with pregnancy No date: Medical history non-contributory No date: Pregnancy     Comment:  edc 11/17/2016.  c/section planned for 11/10/2016 Past Surgical History: 2009: CESAREAN SECTION BMI    Body Mass Index:  23.10 kg/m     Reproductive/Obstetrics negative OB ROS                             Anesthesia Physical Anesthesia Plan  ASA: II  Anesthesia Plan: Spinal   Post-op Pain Management:    Induction:   PONV Risk Score and Plan:   Airway Management Planned:   Additional Equipment:   Intra-op Plan:   Post-operative Plan:   Informed Consent: I have reviewed the patients History and Physical, chart, labs and discussed the procedure including the risks, benefits and alternatives for the proposed anesthesia with the patient or authorized representative who has indicated his/her understanding and acceptance.   Dental  Advisory Given  Plan Discussed with: CRNA  Anesthesia Plan Comments:         Anesthesia Quick Evaluation

## 2016-11-10 NOTE — Progress Notes (Signed)
MD A at bedside

## 2016-11-10 NOTE — Anesthesia Post-op Follow-up Note (Signed)
Anesthesia QCDR form completed.        

## 2016-11-10 NOTE — Anesthesia Procedure Notes (Signed)
Spinal  Patient location during procedure: OR Staffing Performed: anesthesiologist  Preanesthetic Checklist Completed: patient identified, site marked, surgical consent, pre-op evaluation, timeout performed, IV checked, risks and benefits discussed and monitors and equipment checked Spinal Block Patient position: sitting Prep: Betadine Patient monitoring: heart rate, continuous pulse ox, blood pressure and cardiac monitor Approach: midline Location: L4-5 Injection technique: single-shot Needle Needle type: Whitacre and Introducer  Needle gauge: 24 G Needle length: 9 cm Assessment Sensory level: T4 Additional Notes Negative paresthesia. Negative blood return. Positive free-flowing CSF. Expiration date of kit checked and confirmed. Patient tolerated procedure well, without complications.       

## 2016-11-10 NOTE — Progress Notes (Signed)
Called Dr. Noralyn Pickarroll, MDA and OR nurse, Lupita Leashonna, and asked for an estimated time until staff could attend scheduled c/s. Was told it would be at least 30 minutes. MD and nursery nurse notified.

## 2016-11-10 NOTE — Progress Notes (Signed)
Ready for LTCS and BTL . All questions answered  NPO  Labs reviewed  proceed

## 2016-11-10 NOTE — Brief Op Note (Signed)
11/10/2016  11:47 AM  PATIENT:  Kelli Ramirez  26 y.o. female  PRE-OPERATIVE DIAGNOSIS:  repeat cesarean section, sterilization 39+[redacted] weeks EGA   POST-OPERATIVE DIAGNOSIS:  repeat cesarean section, sterilization  PROCEDURE:  Procedure(s) with comments: CESAREAN SECTION WITH BILATERAL TUBAL LIGATION (Bilateral) - birth [id number]  SURGEON:  Surgeon(s) and Role:    * Schermerhorn, Ihor Austinhomas J, MD - Primary  PHYSICIAN ASSISTANT:scrub tech   ASSISTANTS: none   ANESTHESIA:   spinal  EBL:  500 mL   BLOOD ADMINISTERED:none  DRAINS: Urinary Catheter (Foley)   LOCAL MEDICATIONS USED:  MARCAINE    and BUPIVICAINE   SPECIMEN:  Source of Specimen:  portion right and left fallopian tube   DISPOSITION OF SPECIMEN:  PATHOLOGY  COUNTS:  YES  TOURNIQUET:  * No tourniquets in log *  DICTATION: .Other Dictation: Dictation Number verbal  PLAN OF CARE: Admit to inpatient   PATIENT DISPOSITION:  PACU - hemodynamically stable.   Delay start of Pharmacological VTE agent (>24hrs) due to surgical blood loss or risk of bleeding: not applicable

## 2016-11-10 NOTE — Transfer of Care (Signed)
Immediate Anesthesia Transfer of Care Note  Patient: Kelli Ramirez  Procedure(s) Performed: CESAREAN SECTION WITH BILATERAL TUBAL LIGATION (Bilateral )  Patient Location: PACU  Anesthesia Type:General  Level of Consciousness: awake, alert  and oriented  Airway & Oxygen Therapy: Patient Spontanous Breathing  Post-op Assessment: Report given to RN and Post -op Vital signs reviewed and stable  Post vital signs: Reviewed and stable  Last Vitals:  Vitals:   11/10/16 0837 11/10/16 1154  BP:  120/66  Pulse:    Resp:  12  Temp: 36.6 C (!) 36.3 C  SpO2:  98%    Last Pain:  Vitals:   11/10/16 0837  TempSrc: Oral  PainSc:          Complications: No apparent anesthesia complications

## 2016-11-11 LAB — CBC
HEMATOCRIT: 25.3 % — AB (ref 35.0–47.0)
HEMOGLOBIN: 8.9 g/dL — AB (ref 12.0–16.0)
MCH: 33 pg (ref 26.0–34.0)
MCHC: 35.2 g/dL (ref 32.0–36.0)
MCV: 93.7 fL (ref 80.0–100.0)
PLATELETS: 154 10*3/uL (ref 150–440)
RBC: 2.7 MIL/uL — AB (ref 3.80–5.20)
RDW: 13.3 % (ref 11.5–14.5)
WBC: 8.5 10*3/uL (ref 3.6–11.0)

## 2016-11-11 LAB — SURGICAL PATHOLOGY

## 2016-11-11 MED ORDER — FERROUS SULFATE 325 (65 FE) MG PO TABS
325.0000 mg | ORAL_TABLET | Freq: Two times a day (BID) | ORAL | Status: DC
  Administered 2016-11-11 – 2016-11-13 (×4): 325 mg via ORAL
  Filled 2016-11-11 (×4): qty 1

## 2016-11-11 NOTE — Progress Notes (Signed)
Subjective: Postpartum Day : Cesarean Delivery Patient reports nausea, incisional pain and tolerating PO.  Nausea controlled with IM phenergine and iv zofran yesterday, was intermittent.  Objective: Vital signs in last 24 hours: Temp:  [97.3 F (36.3 C)-98.7 F (37.1 C)] 98 F (36.7 C) (10/26 0422) Pulse Rate:  [68-100] 77 (10/26 0422) Resp:  [12-24] 20 (10/26 0422) BP: (93-128)/(53-87) 101/57 (10/26 0422) SpO2:  [97 %-100 %] 99 % (10/26 0422)  Physical Exam:  General: alert, cooperative and appears stated age 3Lochia: appropriate Uterine Fundus: firm Incision: healing well, no significant drainage, no dehiscence, no significant erythema DVT Evaluation: No evidence of DVT seen on physical exam.   Recent Labs  11/09/16 1353 11/11/16 0512  HGB 11.0* 8.9*  HCT 31.2* 25.3*    Assessment/Plan: Status post Cesarean section. Postoperative course complicated by acute blood loss anemia: On po iron, asx  Continue current care.  Christeen DouglasBethany Stacyann Mcconaughy 11/11/2016, 9:00 AM

## 2016-11-11 NOTE — Anesthesia Postprocedure Evaluation (Signed)
Anesthesia Post Note  Patient: Kelli Ramirez  Procedure(s) Performed: CESAREAN SECTION WITH BILATERAL TUBAL LIGATION (Bilateral )  Patient location during evaluation: PACU Anesthesia Type: Spinal Level of consciousness: oriented and awake and alert Pain management: pain level controlled Vital Signs Assessment: post-procedure vital signs reviewed and stable Respiratory status: spontaneous breathing, respiratory function stable and patient connected to nasal cannula oxygen Cardiovascular status: blood pressure returned to baseline and stable Postop Assessment: no headache, no backache and no apparent nausea or vomiting Anesthetic complications: no     Last Vitals:  Vitals:   11/10/16 2321 11/11/16 0422  BP: 103/66 (!) 101/57  Pulse: 81 77  Resp: 20 20  Temp: 36.8 C 36.7 C  SpO2: 99% 99%    Last Pain:  Vitals:   11/11/16 0422  TempSrc: Oral  PainSc:                  Kelli Ramirez,  Kelli Ramirez

## 2016-11-11 NOTE — Clinical Social Work Maternal (Signed)
CLINICAL SOCIAL WORK MATERNAL/CHILD NOTE  Patient Details  Name: Kelli Ramirez MRN: [id number] Date of Birth: April 12, 1990  Date:  11/11/2016  Clinical Social Worker Initiating Note:  York SpanielMonica Cotton Beckley MSW,LCSW Date/Time: Initiated:  11/11/16/      Child's Name:      Biological Parents:  Mother   Need for Interpreter:  None   Reason for Referral:  Current Substance Use/Substance Use During Pregnancy    Address:  [address] [place] [address]    Phone number:  [phone] (home)     Additional phone number: none  Household Members/Support Persons (HM/SP):       HM/SP Name Relationship DOB or Age  HM/SP -1        HM/SP -2        HM/SP -3        HM/SP -4        HM/SP -5        HM/SP -6        HM/SP -7        HM/SP -8          Natural Supports (not living in the home):  Parent   Professional Supports: None   Employment: Unemployed   Type of Work:     Education:      Homebound arranged:    Surveyor, quantityinancial Resources:  Medicaid   Other Resources:  [employer]IC, Sales executiveood Stamps    Cultural/Religious Considerations Which May Impact Care:  none  Strengths:  Ability to meet basic needs , Home prepared for child , Understanding of illness   Psychotropic Medications:         Pediatrician:       [place]    [place]    [place]      Pediatrician Fax Number:    Risk Factors/Current Problems:  Substance Use    Cognitive State:  Alert , Able to Concentrate    Mood/Affect:  Calm , Bright , Relaxed    CSW Assessment: CSW informed by nursing that patient was positive for marijuana and cocaine at the beginning of her pregnancy. Patient's drug screen is negative for illicit substances. Patient's newborn's cord tissue is pending. CSW spoke with patient and explained role and purpose of visit. Patient was pleasant and cooperative. Patient states she lives with her  boyfriend/father of baby  And their 28 year old son. 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 She initially used marijuana for the anxiety but when she found out she was pregnant, she spoke with doctor and the doctor began zoloft 25 mg BID. Patient states that the cocaine use was intermittent prior to her pregnancy and did not use it but one time at the beginning and she did not know she was pregnant. Patient states she is not going to restart using and does not need rehab resources. CSW explained that if her newborn's cord tissue returned positive for illicit substances, that a DSS CPS report would be made. Patient verbalized understanding. Patient verbalized as well that she received education on postpartum depression and the signs and symptoms.   As a side note, patient's zoloft dose was a very low dose but she states she took it all throughout her pregnancy.  CSW spoke with nursing to see if the physician is going to taper her off the zoloft rather than abruptly stopping it.   CSW Plan/Description:  CSW Will Continue to Monitor Umbilical Cord Tissue Drug Screen Results and Make Report if [facility], LCSW 11/11/2016, 10:13 AM

## 2016-11-12 MED ORDER — NICOTINE 7 MG/24HR TD PT24
7.0000 mg | MEDICATED_PATCH | Freq: Every day | TRANSDERMAL | Status: DC
  Administered 2016-11-12 – 2016-11-13 (×2): 7 mg via TRANSDERMAL
  Filled 2016-11-12 (×2): qty 1

## 2016-11-12 NOTE — Progress Notes (Signed)
Subjective: Postpartum Day 2 : Cesarean Delivery Patient reports nausea, incisional pain and tolerating PO. Improving daily  Objective: Vital signs in last 24 hours: Temp:  [97.7 F (36.5 C)-98.5 F (36.9 C)] 97.7 F (36.5 C) (10/27 0847) Pulse Rate:  [86-95] 95 (10/27 0847) Resp:  [16-18] 18 (10/26 2028) BP: (92-115)/(56-63) 92/58 (10/27 0847) SpO2:  [98 %-99 %] 99 % (10/27 0847)  Physical Exam:  General: alert, cooperative and appears stated age Lochia: appropriate Uterine Fundus: firm Incision: healing well, no significant drainage, no dehiscence, no significant erythema DVT Evaluation: No evidence of DVT seen on physical exam.   Recent Labs  11/09/16 1353 11/11/16 0512  HGB 11.0* 8.9*  HCT 31.2* 25.3*    Assessment/Plan: Status post Cesarean section. Postoperative course complicated by acute blood loss anemia: On po iron, asx  Continue current care.  Kelli Ramirez 11/12/2016, 9:48 AM

## 2016-11-13 MED ORDER — OXYCODONE HCL 5 MG PO TABS: 5.0000 mg | ORAL_TABLET | ORAL | 0 refills | Status: AC | PRN

## 2016-11-13 MED ORDER — IBUPROFEN 600 MG PO TABS: 600.0000 mg | ORAL_TABLET | Freq: Four times a day (QID) | ORAL | 1 refills | Status: AC
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
## 2016-11-13 NOTE — Progress Notes (Signed)
Patient discharged home with infant and significant other. Discharge instructions, prescriptions, hygiene kit and follow up appointment given to and reviewed with patient and significant other. Patient verbalized understanding. Escorted out via wheelchair by nursing staff.  

## 2016-11-17 NOTE — Telephone Encounter (Signed)
No further documentation needed. Closing encounter. 

## 2017-07-17 DIAGNOSIS — Z5181 Encounter for therapeutic drug level monitoring: Secondary | ICD-10-CM | POA: Diagnosis not present

## 2017-07-19 DIAGNOSIS — Z5181 Encounter for therapeutic drug level monitoring: Secondary | ICD-10-CM | POA: Diagnosis not present

## 2017-07-24 DIAGNOSIS — Z5181 Encounter for therapeutic drug level monitoring: Secondary | ICD-10-CM | POA: Diagnosis not present

## 2017-07-26 DIAGNOSIS — Z5181 Encounter for therapeutic drug level monitoring: Secondary | ICD-10-CM | POA: Diagnosis not present

## 2017-10-17 DIAGNOSIS — Z5181 Encounter for therapeutic drug level monitoring: Secondary | ICD-10-CM | POA: Diagnosis not present

## 2017-11-13 DIAGNOSIS — Z5181 Encounter for therapeutic drug level monitoring: Secondary | ICD-10-CM | POA: Diagnosis not present

## 2017-11-15 DIAGNOSIS — Z5181 Encounter for therapeutic drug level monitoring: Secondary | ICD-10-CM | POA: Diagnosis not present

## 2017-11-21 DIAGNOSIS — Z5181 Encounter for therapeutic drug level monitoring: Secondary | ICD-10-CM | POA: Diagnosis not present

## 2017-11-27 DIAGNOSIS — Z5181 Encounter for therapeutic drug level monitoring: Secondary | ICD-10-CM | POA: Diagnosis not present

## 2017-12-04 DIAGNOSIS — Z5181 Encounter for therapeutic drug level monitoring: Secondary | ICD-10-CM | POA: Diagnosis not present

## 2017-12-11 DIAGNOSIS — Z5181 Encounter for therapeutic drug level monitoring: Secondary | ICD-10-CM | POA: Diagnosis not present

## 2017-12-18 DIAGNOSIS — Z5181 Encounter for therapeutic drug level monitoring: Secondary | ICD-10-CM | POA: Diagnosis not present

## 2018-01-01 DIAGNOSIS — Z5181 Encounter for therapeutic drug level monitoring: Secondary | ICD-10-CM | POA: Diagnosis not present

## 2018-01-03 ENCOUNTER — Ambulatory Visit (INDEPENDENT_AMBULATORY_CARE_PROVIDER_SITE_OTHER): Payer: Medicaid Other | Admitting: Physician Assistant

## 2018-01-03 ENCOUNTER — Encounter: Payer: Self-pay | Admitting: Physician Assistant

## 2018-01-03 VITALS — BP 111/72 | HR 102 | Temp 98.5°F | Resp 18 | Wt 171.8 lb

## 2018-01-03 DIAGNOSIS — Z131 Encounter for screening for diabetes mellitus: Secondary | ICD-10-CM | POA: Diagnosis not present

## 2018-01-03 DIAGNOSIS — L700 Acne vulgaris: Secondary | ICD-10-CM | POA: Diagnosis not present

## 2018-01-03 DIAGNOSIS — D509 Iron deficiency anemia, unspecified: Secondary | ICD-10-CM | POA: Diagnosis not present

## 2018-01-03 DIAGNOSIS — Z13220 Encounter for screening for lipoid disorders: Secondary | ICD-10-CM

## 2018-01-03 DIAGNOSIS — Z114 Encounter for screening for human immunodeficiency virus [HIV]: Secondary | ICD-10-CM | POA: Diagnosis not present

## 2018-01-03 DIAGNOSIS — Z23 Encounter for immunization: Secondary | ICD-10-CM | POA: Diagnosis not present

## 2018-01-03 DIAGNOSIS — N920 Excessive and frequent menstruation with regular cycle: Secondary | ICD-10-CM

## 2018-01-03 MED ORDER — SPIRONOLACTONE 25 MG PO TABS: 25.0000 mg | ORAL_TABLET | Freq: Every day | ORAL | 0 refills | Status: DC

## 2018-01-03 NOTE — Progress Notes (Signed)
Patient: Kelli Ramirez, Female    DOB: 1990-12-13, 27 y.o.   MRN: [id number] Visit Date: 01/05/2018  Today's Provider: Trey Sailors, PA-C   Chief Complaint  Patient presents with  . New Patient (Initial Visit)   Subjective:    Annual physical exam Kelli Ramirez is a 27 y.o. female who presents today for new patient appoinment. She feels well. She reports exercising none. She reports she is sleeping fairly well.  She is living in [place] with her grandmother and two children, ages 87 and 31 months. She is not breast feeding. Work at [employer]. Smoking 1 pack per week. Alcohol - none, no drugs. PAP smear most recently at [facility] department - due for PAP. Tubal ligation during last pregnancy. Tetanus shot with previous pregnancy. Wants to do flu shot today.  History of anemia due to iron deficiency and heavy periods, not taking iron pill right now. She is interested in IUD to control menorrhagia.   Previously treated for depression during pregnancy and was taking zoloft. She reports her depression improved after delivering and ending her relationship.   She also reports having a history of cystic acne with hard painful lesions on her face that do not express any purulence. She has had this issue her whole life and has tried otc salicylic acid and benzoyl peroxide washes with no relief. She reports her grandmother had a prescription of doxycycline for some other condition that   -----------------------------------------------------------------   Review of Systems  Constitutional: Negative.   HENT: Negative.   Eyes: Negative.   Respiratory: Negative.   Cardiovascular: Negative.   Gastrointestinal: Negative.   Endocrine: Negative.   Genitourinary: Negative.   Musculoskeletal: Negative.   Skin: Negative.   Allergic/Immunologic: Negative.   Neurological: Negative.   Hematological: Negative.   Psychiatric/Behavioral: Negative.     Social History She  reports that she  quit smoking about 21 months ago. Her smoking use included cigarettes. She smoked 0.50 packs per day. She has never used smokeless tobacco. She reports that she does not drink alcohol or use drugs. Social History   Socioeconomic History  . Marital status: Single    Spouse name: Not on file  . Number of children: Not on file  . Years of education: Not on file  . Highest education level: Not on file  Occupational History  . Not on file  Social Needs  . Financial resource strain: Not on file  . Food insecurity:    Worry: Not on file    Inability: Not on file  . Transportation needs:    Medical: Not on file    Non-medical: Not on file  Tobacco Use  . Smoking status: Former Smoker    Packs/day: 0.50    Types: Cigarettes    Last attempt to quit: 04/04/2016    Years since quitting: 1.7  . Smokeless tobacco: Never Used  Substance and Sexual Activity  . Alcohol use: No  . Drug use: No  . Sexual activity: Yes  Lifestyle  . Physical activity:    Days per week: Not on file    Minutes per session: Not on file  . Stress: Not on file  Relationships  . Social connections:    Talks on phone: Not on file    Gets together: Not on file    Attends religious service: Not on file    Active member of club or organization: Not on file    Attends meetings of clubs  or organizations: Not on file    Relationship status: Not on file  Other Topics Concern  . Not on file  Social History Narrative  . Not on file    Patient Active Problem List   Diagnosis Date Noted  . Postoperative state 11/10/2016  . First trimester screening     Past Surgical History:  Procedure Laterality Date  . CESAREAN SECTION  2009  . CESAREAN SECTION WITH BILATERAL TUBAL LIGATION Bilateral 11/10/2016   Procedure: CESAREAN SECTION WITH BILATERAL TUBAL LIGATION;  Surgeon: Suzy Bouchard, MD;  Location: [facility] ORS;  Service: Obstetrics;  Laterality: Bilateral;  birth 47    Family History  Family Status    Relation Name Status  . Mat Uncle  (Not Specified)  . Emelda Brothers  (Not Specified)  . Oneal Grout  (Not Specified)  . MGM  (Not Specified)  . MGF  (Not Specified)   Her family history includes Breast cancer in her maternal grandmother; Diabetes in her maternal uncle; Hypertension in her maternal grandfather, maternal grandmother, and paternal aunt; Thyroid disease in her paternal uncle.     No Known Allergies  Previous Medications   CALCIUM CARBONATE (TUMS - DOSED IN MG ELEMENTAL CALCIUM) 500 MG CHEWABLE TABLET    Chew 1 tablet by mouth 2 (two) times daily as needed for indigestion or heartburn.   FERROUS SULFATE 325 (65 FE) MG EC TABLET    Take 325 mg by mouth daily with breakfast.   IBUPROFEN (ADVIL,MOTRIN) 600 MG TABLET    Take 1 tablet (600 mg total) by mouth every 6 (six) hours.   PRENATAL VIT-FE FUMARATE-FA (MULTIVITAMIN-PRENATAL) 27-0.8 MG TABS TABLET    Take 1 tablet by mouth daily at 12 noon.   SERTRALINE (ZOLOFT) 100 MG TABLET    Take 50 mg by mouth daily. Take 25mg  twice a day with food    Patient Care Team: Maryella Shivers as PCP - General (Physician Assistant)      Objective:   Vitals: BP 111/72 (BP Location: Left Arm, Patient Position: Sitting, Cuff Size: Normal)   Pulse (!) 102   Temp 98.5 F (36.9 C) (Oral)   Resp 18   Wt 171 lb 12.8 oz (77.9 kg)   LMP 12/09/2017   SpO2 98%   BMI 24.65 kg/m    Physical Exam Constitutional:      Appearance: Normal appearance.  HENT:     Right Ear: Tympanic membrane and ear canal normal.     Left Ear: Tympanic membrane and ear canal normal.  Neck:     Musculoskeletal: Neck supple.  Cardiovascular:     Rate and Rhythm: Normal rate and regular rhythm.     Heart sounds: Normal heart sounds. No murmur.  Pulmonary:     Effort: Pulmonary effort is normal.     Breath sounds: Normal breath sounds.  Abdominal:     General: Abdomen is flat. Bowel sounds are normal.     Palpations: Abdomen is soft.  Skin:    General: Skin  is warm and dry.     Findings: Lesion present.     Comments: Cystic acne lesions on face.   Neurological:     Mental Status: She is alert and oriented to person, place, and time. Mental status is at baseline.  Psychiatric:        Mood and Affect: Mood normal.        Behavior: Behavior normal.      Depression Screen PHQ 2/9 Scores 01/03/2018  PHQ - 2 Score 1  PHQ- 9 Score 5      Assessment & Plan:     Routine Health Maintenance and Physical Exam  Exercise Activities and Dietary recommendations Goals   None     Immunization History  Administered Date(s) Administered  . Hepatitis A, Adult 01/03/2018  . Influenza,inj,Quad PF,6+ Mos 01/03/2018    Health Maintenance  Topic Date Due  . TETANUS/TDAP  03/09/2007  . PAP-Cervical Cytology Screening  03/08/2009  . PAP SMEAR-Modifier  03/08/2009  . INFLUENZA VACCINE  Completed  . HIV Screening  Completed     Discussed health benefits of physical activity, and encouraged her to engage in regular exercise appropriate for her age and condition.    1. Iron deficiency anemia, unspecified iron deficiency anemia type  Check iron panel today, she would like to get IUD to stop cycle. Reports she had a mirena previously and did not have menstrual cycle on this. She can get an IUD and PAP at the same time.   - CBC with Differential - Fe+TIBC+Fer  2. Menorrhagia with regular cycle  - TSH  3. Encounter for screening for HIV  - HIV antibody (with reflex)  4. Screening cholesterol level  - Lipid Profile  5. Diabetes mellitus screening  - Comprehensive Metabolic Panel (CMET)  6. Cystic acne  Chronic issue, uncontrolled. Counseled that doxycycline is usually given for finite periods of time for acne. We can try spironolactone as below, if this does not work she can be evaluated by a dermatologist for further treatment.  - spironolactone (ALDACTONE) 25 MG tablet; Take 1 tablet (25 mg total) by mouth daily.  Dispense: 90  tablet; Refill: 0  7. Need for influenza vaccination  - Flu Vaccine QUAD 36+ mos IM  8. Need for hepatitis A immunization  - Hepatitis A vaccine adult IM  The entirety of the information documented in the History of Present Illness, Review of Systems and Physical Exam were personally obtained by me. Portions of this information were initially documented by Dara LordsPorsha Carpenter, CMA and reviewed by me for thoroughness and accuracy.   Return in about 2 months (around 03/06/2018) for acne .      --------------------------------------------------------------------

## 2018-01-03 NOTE — Patient Instructions (Signed)
Intrauterine Device Insertion, Care After    This sheet gives you information about how to care for yourself after your procedure. Your health care provider may also give you more specific instructions. If you have problems or questions, contact your health care provider.  What can I expect after the procedure?  After the procedure, it is common to have:  · Cramps and pain in the abdomen.  · Light bleeding (spotting) or heavier bleeding that is like your menstrual period. This may last for up to a few days.  · Lower back pain.  · Dizziness.  · Headaches.  · Nausea.  Follow these instructions at home:  · Before resuming sexual activity, check to make sure that you can feel the IUD string(s). You should be able to feel the end of the string(s) below the opening of your cervix. If your IUD string is in place, you may resume sexual activity.  ? If you had a hormonal IUD inserted more than 7 days after your most recent period started, you will need to use a backup method of birth control for 7 days after IUD insertion. Ask your health care provider whether this applies to you.  · Continue to check that the IUD is still in place by feeling for the string(s) after every menstrual period, or once a month.  · Take over-the-counter and prescription medicines only as told by your health care provider.  · Do not drive or use heavy machinery while taking prescription pain medicine.  · Keep all follow-up visits as told by your health care provider. This is important.  Contact a health care provider if:  · You have bleeding that is heavier or lasts longer than a normal menstrual cycle.  · You have a fever.  · You have cramps or abdominal pain that get worse or do not get better with medicine.  · You develop abdominal pain that is new or is not in the same area of earlier cramping and pain.  · You feel lightheaded or weak.  · You have abnormal or bad-smelling discharge from your vagina.  · You have pain during sexual  activity.  · You have any of the following problems with your IUD string(s):  ? The string bothers or hurts you or your sexual partner.  ? You cannot feel the string.  ? The string has gotten longer.  · You can feel the IUD in your vagina.  · You think you may be pregnant, or you miss your menstrual period.  · You think you may have an STI (sexually transmitted infection).  Get help right away if:  · You have flu-like symptoms.  · You have a fever and chills.  · You can feel that your IUD has slipped out of place.  Summary  · After the procedure, it is common to have cramps and pain in the abdomen. It is also common to have light bleeding (spotting) or heavier bleeding that is like your menstrual period.  · Continue to check that the IUD is still in place by feeling for the string(s) after every menstrual period, or once a month.  · Keep all follow-up visits as told by your health care provider. This is important.  · Contact your health care provider if you have problems with your IUD string(s), such as the string getting longer or bothering you or your sexual partner.  This information is not intended to replace advice given to you by your health care provider. Make   sure you discuss any questions you have with your health care provider.  Document Released: 09/01/2010 Document Revised: 11/25/2015 Document Reviewed: 11/25/2015  Elsevier Interactive Patient Education © 2019 [employer]

## 2018-01-04 ENCOUNTER — Telehealth: Payer: Self-pay

## 2018-01-04 LAB — COMPREHENSIVE METABOLIC PANEL
ALT: 15 IU/L (ref 0–32)
AST: 12 IU/L (ref 0–40)
Albumin/Globulin Ratio: 1.9 (ref 1.2–2.2)
Albumin: 4.6 g/dL (ref 3.5–5.5)
Alkaline Phosphatase: 67 IU/L (ref 39–117)
BUN/Creatinine Ratio: 14 (ref 9–23)
BUN: 11 mg/dL (ref 6–20)
Bilirubin Total: 0.3 mg/dL (ref 0.0–1.2)
CO2: 27 mmol/L (ref 20–29)
Calcium: 9.6 mg/dL (ref 8.7–10.2)
Chloride: 101 mmol/L (ref 96–106)
Creatinine, Ser: 0.8 mg/dL (ref 0.57–1.00)
GFR calc Af Amer: 115 mL/min/{1.73_m2} (ref >59)
GFR calc non Af Amer: 100 mL/min/{1.73_m2} (ref >59)
Globulin, Total: 2.4 g/dL (ref 1.5–4.5)
Glucose: 83 mg/dL (ref 65–99)
Potassium: 4.3 mmol/L (ref 3.5–5.2)
Sodium: 140 mmol/L (ref 134–144)
Total Protein: 7 g/dL (ref 6.0–8.5)

## 2018-01-04 LAB — LIPID PANEL
Chol/HDL Ratio: 2.8 ratio (ref 0.0–4.4)
Cholesterol, Total: 138 mg/dL (ref 100–199)
HDL: 50 mg/dL (ref >39)
LDL Calculated: 72 mg/dL (ref 0–99)
Triglycerides: 80 mg/dL (ref 0–149)
VLDL Cholesterol Cal: 16 mg/dL (ref 5–40)

## 2018-01-04 LAB — CBC WITH DIFFERENTIAL/PLATELET
Basophils Absolute: 0 10*3/uL (ref 0.0–0.2)
Basos: 0 %
EOS (ABSOLUTE): 0.2 10*3/uL (ref 0.0–0.4)
Eos: 3 %
Hematocrit: 39.1 % (ref 34.0–46.6)
Hemoglobin: 13.1 g/dL (ref 11.1–15.9)
Immature Grans (Abs): 0 10*3/uL (ref 0.0–0.1)
Immature Granulocytes: 0 %
Lymphocytes Absolute: 1.7 10*3/uL (ref 0.7–3.1)
Lymphs: 18 %
MCH: 31.2 pg (ref 26.6–33.0)
MCHC: 33.5 g/dL (ref 31.5–35.7)
MCV: 93 fL (ref 79–97)
Monocytes Absolute: 0.6 10*3/uL (ref 0.1–0.9)
Monocytes: 6 %
Neutrophils Absolute: 6.8 10*3/uL (ref 1.4–7.0)
Neutrophils: 73 %
Platelets: 238 10*3/uL (ref 150–450)
RBC: 4.2 x10E6/uL (ref 3.77–5.28)
RDW: 12.1 % — ABNORMAL LOW (ref 12.3–15.4)
WBC: 9.4 10*3/uL (ref 3.4–10.8)

## 2018-01-04 LAB — HIV ANTIBODY (ROUTINE TESTING W REFLEX): HIV Screen 4th Generation wRfx: NONREACTIVE

## 2018-01-04 LAB — IRON,TIBC AND FERRITIN PANEL
Ferritin: 22 ng/mL (ref 15–150)
Iron Saturation: 18 % (ref 15–55)
Iron: 64 ug/dL (ref 27–159)
Total Iron Binding Capacity: 355 ug/dL (ref 250–450)
UIBC: 291 ug/dL (ref 131–425)

## 2018-01-04 LAB — TSH: TSH: 0.961 u[IU]/mL (ref 0.450–4.500)

## 2018-01-04 NOTE — Telephone Encounter (Signed)
-----   Message from Trey SailorsAdriana M Pollak, [place]PA-C sent at 01/04/2018  9:25 AM EST ----- Loney LaurenceLabwork is all normal, including blood counts and iron panel.

## 2018-01-04 NOTE — Telephone Encounter (Signed)
Patient advised as directed below. 

## 2018-01-11 DIAGNOSIS — Z5181 Encounter for therapeutic drug level monitoring: Secondary | ICD-10-CM | POA: Diagnosis not present

## 2018-01-15 DIAGNOSIS — Z5181 Encounter for therapeutic drug level monitoring: Secondary | ICD-10-CM | POA: Diagnosis not present

## 2018-01-22 DIAGNOSIS — Z5181 Encounter for therapeutic drug level monitoring: Secondary | ICD-10-CM | POA: Diagnosis not present

## 2018-01-24 DIAGNOSIS — Z5181 Encounter for therapeutic drug level monitoring: Secondary | ICD-10-CM | POA: Diagnosis not present

## 2018-01-29 DIAGNOSIS — Z5181 Encounter for therapeutic drug level monitoring: Secondary | ICD-10-CM | POA: Diagnosis not present

## 2018-02-01 ENCOUNTER — Other Ambulatory Visit: Payer: Self-pay

## 2018-02-01 ENCOUNTER — Emergency Department
Admission: EM | Admit: 2018-02-01 | Discharge: 2018-02-01 | Disposition: A | Discharge status: Home / Self Care | Payer: Medicaid Other | Attending: Emergency Medicine | Admitting: Emergency Medicine

## 2018-02-01 ENCOUNTER — Encounter: Payer: Self-pay | Admitting: *Deleted

## 2018-02-01 DIAGNOSIS — Z87891 Personal history of nicotine dependence: Secondary | ICD-10-CM | POA: Insufficient documentation

## 2018-02-01 DIAGNOSIS — J020 Streptococcal pharyngitis: Secondary | ICD-10-CM | POA: Diagnosis not present

## 2018-02-01 DIAGNOSIS — J0100 Acute maxillary sinusitis, unspecified: Secondary | ICD-10-CM | POA: Insufficient documentation

## 2018-02-01 DIAGNOSIS — R0981 Nasal congestion: Secondary | ICD-10-CM | POA: Diagnosis present

## 2018-02-01 DIAGNOSIS — Z79899 Other long term (current) drug therapy: Secondary | ICD-10-CM | POA: Insufficient documentation

## 2018-02-01 LAB — GROUP A STREP BY PCR: GROUP A STREP BY PCR: DETECTED — AB

## 2018-02-01 LAB — INFLUENZA PANEL BY PCR (TYPE A & B)
INFLBPCR: NEGATIVE
Influenza A By PCR: NEGATIVE

## 2018-02-01 MED ORDER — AMOXICILLIN-POT CLAVULANATE 875-125 MG PO TABS: 1.0000 | ORAL_TABLET | Freq: Two times a day (BID) | ORAL | 0 refills | Status: AC

## 2018-02-01 MED ORDER — ACETAMINOPHEN 325 MG PO TABS
650.0000 mg | ORAL_TABLET | Freq: Once | ORAL | Status: AC
  Administered 2018-02-01: 650 mg via ORAL
  Filled 2018-02-01: qty 2

## 2018-02-01 MED ORDER — AMOXICILLIN-POT CLAVULANATE 875-125 MG PO TABS
1.0000 | ORAL_TABLET | Freq: Once | ORAL | Status: AC
  Administered 2018-02-01: 1 via ORAL
  Filled 2018-02-01: qty 1

## 2018-02-01 MED ORDER — FLUTICASONE PROPIONATE 50 MCG/ACT NA SUSP: 2.0000 | Freq: Every day | NASAL | 0 refills | Status: DC

## 2018-02-01 NOTE — ED Provider Notes (Signed)
[facility] Emergency Department Provider Note  ____________________________________________  Time seen: Approximately 6:55 PM  I have reviewed the triage vital signs and the nursing notes.   HISTORY  Chief Complaint Influenza    HPI Kelli Ramirez is a 28 y.o. female the presents to the emergency department for evaluation of nasal congestion for 2 weeks and chills and body aches for 1 day.  Patient states that she can feel the lymph node on her neck and is not sure when that started.  Patient is concerned that she has a sinus infection.  No sick contacts.  No vomiting, diarrhea.  Past Medical History:  Diagnosis Date  . Anemia    takes iron supplements  . Depression   . GERD (gastroesophageal reflux disease)    with pregnancy  . Medical history non-contributory   . Pregnancy    edc 11/17/2016.  c/section planned for 11/10/2016    Patient Active Problem List   Diagnosis Date Noted  . Postoperative state 11/10/2016  . First trimester screening     Past Surgical History:  Procedure Laterality Date  . CESAREAN SECTION  2009  . CESAREAN SECTION WITH BILATERAL TUBAL LIGATION Bilateral 11/10/2016   Procedure: CESAREAN SECTION WITH BILATERAL TUBAL LIGATION;  Surgeon: Suzy BouchardSchermerhorn, Thomas J, MD;  Location: [facility] ORS;  Service: Obstetrics;  Laterality: Bilateral;  birth [id number]    Prior to Admission medications   Medication Sig Start Date End Date Taking? Authorizing Provider  amoxicillin-clavulanate (AUGMENTIN) 875-125 MG tablet Take 1 tablet by mouth 2 (two) times daily for 10 days. 02/01/18 02/11/18  Enid DerryWagner, Nelson Noone, PA-C  calcium carbonate (TUMS - DOSED IN MG ELEMENTAL CALCIUM) 500 MG chewable tablet Chew 1 tablet by mouth 2 (two) times daily as needed for indigestion or heartburn.    [provider]  ferrous sulfate 325 (65 FE) MG EC tablet Take 325 mg by mouth daily with breakfast.    [provider]  fluticasone (FLONASE) 50 MCG/ACT  nasal spray Place 2 sprays into both nostrils daily. 02/01/18 02/01/19  Enid DerryWagner, Sussie Minor, PA-C  ibuprofen (ADVIL,MOTRIN) 600 MG tablet Take 1 tablet (600 mg total) by mouth every 6 (six) hours. 11/13/16   Ward, Elenora Fenderhelsea C, MD  Prenatal Vit-Fe Fumarate-FA (MULTIVITAMIN-PRENATAL) 27-0.8 MG TABS tablet Take 1 tablet by mouth daily at 12 noon.    [provider]  sertraline (ZOLOFT) 100 MG tablet Take 50 mg by mouth daily. Take 25mg  twice a day with food    [provider]  spironolactone (ALDACTONE) 25 MG tablet Take 1 tablet (25 mg total) by mouth daily. 01/03/18 04/03/18  Trey SailorsPollak, Adriana M, PA-C    Allergies Patient has no known allergies.  Family History  Problem Relation Age of Onset  . Diabetes Maternal Uncle   . Hypertension Paternal Aunt   . Thyroid disease Paternal Uncle   . Breast cancer Maternal Grandmother   . Hypertension Maternal Grandmother   . Hypertension Maternal Grandfather     Social History Social History   Tobacco Use  . Smoking status: Former Smoker    Packs/day: 0.50    Types: Cigarettes    Last attempt to quit: 04/04/2016    Years since quitting: 1.8  . Smokeless tobacco: Never Used  Substance Use Topics  . Alcohol use: No  . Drug use: No     Review of Systems  Constitutional: Positive for chills Eyes: No visual changes. No discharge. ENT: Positive for congestion and rhinorrhea. Cardiovascular: No chest pain. Respiratory: Negative for  cough. No SOB. Gastrointestinal: No abdominal pain.  No nausea, no vomiting.  No diarrhea.  No constipation. Musculoskeletal: Negative for musculoskeletal pain. Skin: Negative for rash, abrasions, lacerations, ecchymosis. Neurological: Negative for headaches.   ____________________________________________   PHYSICAL EXAM:  VITAL SIGNS: ED Triage Vitals  Enc Vitals Group     BP 02/01/18 1749 115/65     Pulse Rate 02/01/18 1749 (!) 116     Resp 02/01/18 1749 16     Temp 02/01/18 1749 100 F (37.8  C)     Temp Source 02/01/18 1749 Oral     SpO2 02/01/18 1749 100 %     Weight 02/01/18 1750 175 lb (79.4 kg)     Height 02/01/18 1750 5\' 10"  (1.778 m)     Head Circumference --      Peak Flow --      Pain Score 02/01/18 1749 8     Pain Loc --      Pain Edu? --      Excl. in GC? --      Constitutional: Alert and oriented. Well appearing and in no acute distress. Eyes: Conjunctivae are normal. PERRL. EOMI. No discharge. Head: Atraumatic. ENT: Maxillary sinus tenderness.      Ears: Tympanic membranes pearly gray with good landmarks. No discharge.      Nose: Mild congestion/rhinnorhea.      Mouth/Throat: Mucous membranes are moist. Oropharynx erythematous. Tonsils not enlarged. No exudates. Uvula midline. Neck: No stridor.   Hematological/Lymphatic/Immunilogical: No cervical lymphadenopathy. Cardiovascular: Normal rate, regular rhythm.  Good peripheral circulation. Respiratory: Normal respiratory effort without tachypnea or retractions. Lungs CTAB. Good air entry to the bases with no decreased or absent breath sounds. Gastrointestinal: Bowel sounds 4 quadrants. Soft and nontender to palpation. No guarding or rigidity. No palpable masses. No distention. Musculoskeletal: Full range of motion to all extremities. No gross deformities appreciated. Neurologic:  Normal speech and language. No gross focal neurologic deficits are appreciated.  Skin:  Skin is warm, dry and intact. No rash noted. Psychiatric: Mood and affect are normal. Speech and behavior are normal. Patient exhibits appropriate insight and judgement.   ____________________________________________   LABS (all labs ordered are listed, but only abnormal results are displayed)  Labs Reviewed  GROUP A STREP BY PCR - Abnormal; Notable for the following components:      Result Value   Group A Strep by PCR DETECTED (*)    All other components within normal limits  INFLUENZA PANEL BY PCR (TYPE A & B)    ____________________________________________  EKG   ____________________________________________  RADIOLOGY   No results found.  ____________________________________________    PROCEDURES  Procedure(s) performed:    Procedures    Medications  amoxicillin-clavulanate (AUGMENTIN) 875-125 MG per tablet 1 tablet (has no administration in time range)  acetaminophen (TYLENOL) tablet 650 mg (has no administration in time range)     ____________________________________________   INITIAL IMPRESSION / ASSESSMENT AND PLAN / ED COURSE  Pertinent labs & imaging results that were available during my care of the patient were reviewed by me and considered in my medical decision making (see chart for details).  Review of the [place] CSRS was performed in accordance of the [facility] prior to dispensing any controlled drugs.     Patient's diagnosis is consistent with strep throat and sinusitis. Vital signs and exam are reassuring.  Strep throat test is positive.  Influenza test is negative.  Patient will increase her fluid intake. Patient should alternate tylenol and ibuprofen for  fever. Patient feels comfortable going home. Patient will be discharged home with prescriptions for Augmentin and Flonase. Patient is to follow up with primary care as needed or otherwise directed. Patient is given ED precautions to return to the ED for any worsening or new symptoms.     ____________________________________________  FINAL CLINICAL IMPRESSION(S) / ED DIAGNOSES  Final diagnoses:  Strep throat  Acute non-recurrent maxillary sinusitis      NEW MEDICATIONS STARTED DURING THIS VISIT:  ED Discharge Orders         Ordered    amoxicillin-clavulanate (AUGMENTIN) 875-125 MG tablet  2 times daily     02/01/18 2003    fluticasone (FLONASE) 50 MCG/ACT nasal spray  Daily     02/01/18 2003              This chart was dictated using voice recognition software/Dragon. Despite best efforts  to proofread, errors can occur which can change the meaning. Any change was purely unintentional.    Enid Derry, PA-C 02/01/18 2308    Minna Antis, MD 02/01/18 [phone]

## 2018-02-01 NOTE — ED Triage Notes (Signed)
Pt to ED reporting flu like symptoms starting last night. Generalized body aches, cold chills and feverish today. No NVD. PT reporting lymph note tenderness in neck. Pt reports no one around her has had the flu.

## 2018-02-06 DIAGNOSIS — Z5181 Encounter for therapeutic drug level monitoring: Secondary | ICD-10-CM | POA: Diagnosis not present

## 2018-02-12 DIAGNOSIS — Z5181 Encounter for therapeutic drug level monitoring: Secondary | ICD-10-CM | POA: Diagnosis not present

## 2018-02-26 DIAGNOSIS — Z5181 Encounter for therapeutic drug level monitoring: Secondary | ICD-10-CM | POA: Diagnosis not present

## 2018-03-05 DIAGNOSIS — Z5181 Encounter for therapeutic drug level monitoring: Secondary | ICD-10-CM | POA: Diagnosis not present

## 2018-03-07 ENCOUNTER — Ambulatory Visit (INDEPENDENT_AMBULATORY_CARE_PROVIDER_SITE_OTHER): Payer: Medicaid Other | Admitting: Family Medicine

## 2018-03-07 ENCOUNTER — Encounter: Payer: Self-pay | Admitting: Family Medicine

## 2018-03-07 VITALS — BP 108/69 | HR 87 | Temp 97.6°F | Wt 168.6 lb

## 2018-03-07 DIAGNOSIS — Z30430 Encounter for insertion of intrauterine contraceptive device: Secondary | ICD-10-CM

## 2018-03-07 LAB — POCT URINE PREGNANCY: Preg Test, Ur: NEGATIVE

## 2018-03-07 MED ORDER — LEVONORGESTREL 20 MCG/24HR IU IUD
INTRAUTERINE_SYSTEM | Freq: Once | INTRAUTERINE | Status: AC
Start: 2018-03-07 — End: 2018-03-07
  Administered 2018-03-07: 11:00:00 via INTRAUTERINE

## 2018-03-07 NOTE — Progress Notes (Signed)
      Patient: Kelli Ramirez Female    DOB: 30-Oct-1990   27 y.o.   MRN: [id number] Visit Date: 03/07/2018  Today's Provider: Shirlee Latch, MD   Chief Complaint  Patient presents with  . IUD Insertion   Subjective:    No Known Allergies   Current Outpatient Medications:  .  calcium carbonate (TUMS - DOSED IN MG ELEMENTAL CALCIUM) 500 MG chewable tablet, Chew 1 tablet by mouth 2 (two) times daily as needed for indigestion or heartburn., Disp: , Rfl:  .  ferrous sulfate 325 (65 FE) MG EC tablet, Take 325 mg by mouth daily with breakfast., Disp: , Rfl:  .  fluticasone (FLONASE) 50 MCG/ACT nasal spray, Place 2 sprays into both nostrils daily., Disp: 16 g, Rfl: 0 .  ibuprofen (ADVIL,MOTRIN) 600 MG tablet, Take 1 tablet (600 mg total) by mouth every 6 (six) hours., Disp: 65 tablet, Rfl: 1 .  Prenatal Vit-Fe Fumarate-FA (MULTIVITAMIN-PRENATAL) 27-0.8 MG TABS tablet, Take 1 tablet by mouth daily at 12 noon., Disp: , Rfl:  .  sertraline (ZOLOFT) 100 MG tablet, Take 50 mg by mouth daily. Take 25mg  twice a day with food, Disp: , Rfl:  .  spironolactone (ALDACTONE) 25 MG tablet, Take 1 tablet (25 mg total) by mouth daily., Disp: 90 tablet, Rfl: 0  Review of Systems  Constitutional: Negative.   Respiratory: Negative.   Cardiovascular: Negative.   Musculoskeletal: Negative.     Social History   Tobacco Use  . Smoking status: Former Smoker    Packs/day: 0.50    Types: Cigarettes    Last attempt to quit: 04/04/2016    Years since quitting: 1.9  . Smokeless tobacco: Never Used  Substance Use Topics  . Alcohol use: No      Objective:   BP 108/69 (BP Location: Left Arm, Patient Position: Sitting, Cuff Size: Normal)   Pulse 87   Temp 97.6 F (36.4 C) (Oral)   Wt 168 lb 9.6 oz (76.5 kg)   SpO2 98%   BMI 24.19 kg/m  Vitals:   03/07/18 1103  BP: 108/69  Pulse: 87  Temp: 97.6 F (36.4 C)  TempSrc: Oral  SpO2: 98%  Weight: 168 lb 9.6 oz (76.5 kg)    IUD Insertion Procedure  Note  Pre-operative Diagnosis: menorrhagia  Post-operative Diagnosis: normal  Indications: menorrhagia  Procedure Details  Urine pregnancy test was done and result was negative.  The risks (including infection, bleeding, pain, and uterine perforation) and benefits of the procedure were explained to the patient and Written informed consent was obtained.    Cervix cleansed with Betadine. Tenaculum applied to anterior cervix.  Uterus sounded to 9 cm. IUD inserted without difficulty. String visible and trimmed. Patient tolerated procedure well.  IUD Information: Mirena, Lot # [id number], Expiration date Feb 2022.  Condition: Stable  Complications: None  Plan:  The patient was advised to call for any fever or for prolonged or severe pain or bleeding. She was advised to use OTC analgesics as needed for mild to moderate pain.    The entirety of the information documented in the History of Present Illness, Review of Systems and Physical Exam were personally obtained by me. Portions of this information were initially documented by Presley Raddle, CMA and reviewed by me for thoroughness and accuracy.   Erasmo Downer, MD, MPH [facility] 03/07/2018 11:23 AM

## 2018-03-07 NOTE — Patient Instructions (Signed)

## 2018-03-12 DIAGNOSIS — Z5181 Encounter for therapeutic drug level monitoring: Secondary | ICD-10-CM | POA: Diagnosis not present

## 2018-03-19 DIAGNOSIS — Z5181 Encounter for therapeutic drug level monitoring: Secondary | ICD-10-CM | POA: Diagnosis not present

## 2018-04-04 ENCOUNTER — Other Ambulatory Visit (HOSPITAL_COMMUNITY)
Admission: RE | Admit: 2018-04-04 | Discharge: 2018-04-04 | Disposition: A | Discharge status: Home / Self Care | Payer: Medicaid Other | Source: Ambulatory Visit | Attending: Physician Assistant | Admitting: Physician Assistant

## 2018-04-04 ENCOUNTER — Encounter: Payer: Self-pay | Admitting: Physician Assistant

## 2018-04-04 ENCOUNTER — Other Ambulatory Visit: Payer: Self-pay

## 2018-04-04 ENCOUNTER — Ambulatory Visit: Payer: Medicaid Other | Admitting: Physician Assistant

## 2018-04-04 VITALS — BP 98/60 | HR 79 | Temp 98.4°F | Resp 16 | Wt 177.0 lb

## 2018-04-04 DIAGNOSIS — L700 Acne vulgaris: Secondary | ICD-10-CM

## 2018-04-04 DIAGNOSIS — Z124 Encounter for screening for malignant neoplasm of cervix: Secondary | ICD-10-CM | POA: Insufficient documentation

## 2018-04-04 NOTE — Patient Instructions (Signed)
Pap Test  Why am I having this test?  A Pap test, also called a Pap smear, is a screening test to check for signs of:  · Cancer of the vagina, cervix, and uterus. The cervix is the lower part of the uterus that opens into the vagina.  · Infection.  · Changes that may be a sign that cancer is developing (precancerous changes).  Women need this test on a regular basis. In general, you should have a Pap test every 3 years until you reach menopause or age 30. Women aged 30-60 may choose to have their Pap test done at the same time as an HPV (human papillomavirus) test every 5 years (instead of every 3 years).  Your health care provider may recommend having Pap tests more or less often depending on your medical conditions and past Pap test results.  What kind of sample is taken?    Your health care provider will collect a sample of cells from the surface of your cervix. This will be done using a small cotton swab, plastic spatula, or brush. This sample is often collected during a pelvic exam, when you are lying on your back on an exam table with feet in footrests (stirrups).  In some cases, fluids (secretions) from the cervix or vagina may also be collected.  How do I prepare for this test?  · Be aware of where you are in your menstrual cycle. If you are menstruating on the day of the test, you may be asked to reschedule.  · You may need to reschedule if you have a known vaginal infection on the day of the test.  · Follow instructions from your health care provider about:  ? Changing or stopping your regular medicines. Some medicines can cause abnormal test results, such as digitalis and tetracycline.  ? Avoiding douching or taking a bath the day before or the day of the test.  Tell a health care provider about:  · Any allergies you have.  · All medicines you are taking, including vitamins, herbs, eye drops, creams, and over-the-counter medicines.  · Any blood disorders you have.  · Any surgeries you have had.  · Any  medical conditions you have.  · Whether you are pregnant or may be pregnant.  How are the results reported?  Your test results will be reported as either abnormal or normal.  A false-positive result can occur. A false positive is incorrect because it means that a condition is present when it is not.  A false-negative result can occur. A false negative is incorrect because it means that a condition is not present when it is.  What do the results mean?  A normal test result means that you do not have signs of cancer of the vagina, cervix, or uterus.  An abnormal result may mean that you have:  · Cancer. A Pap test by itself is not enough to diagnose cancer. You will have more tests done in this case.  · Precancerous changes in your vagina, cervix, or uterus.  · Inflammation of the cervix.  · An STD (sexually transmitted disease).  · A fungal infection.  · A parasite infection.  Talk with your health care provider about what your results mean.  Questions to ask your health care provider  Ask your health care provider, or the department that is doing the test:  · When will my results be ready?  · How will I get my results?  · What are my   treatment options?  · What other tests do I need?  · What are my next steps?  Summary  · In general, women should have a Pap test every 3 years until they reach menopause or age 30.  · Your health care provider will collect a sample of cells from the surface of your cervix. This will be done using a small cotton swab, plastic spatula, or brush.  · In some cases, fluids (secretions) from the cervix or vagina may also be collected.  This information is not intended to replace advice given to you by your health care provider. Make sure you discuss any questions you have with your health care provider.  Document Released: 03/26/2002 Document Revised: 09/12/2016 Document Reviewed: 09/12/2016  Elsevier Interactive Patient Education © 2019 Elsevier Inc.

## 2018-04-04 NOTE — Progress Notes (Signed)
       Patient: Kelli Ramirez Female    DOB: 1990/04/30   28 y.o.   MRN: [id number] Visit Date: 04/04/2018  Today's Provider: Trey Sailors, PA-C   Chief Complaint  Patient presents with  . Follow-up   Subjective:     HPI   IUD placed 03/07/2018. Doing well with this, has some spotting. Has no pain. Also due for PAP today.   Was started on spironolactone 25 mg last time for acne. This is not helping .Would like visit for dermatology.   No Known Allergies   Current Outpatient Medications:  .  ibuprofen (ADVIL,MOTRIN) 600 MG tablet, Take 1 tablet (600 mg total) by mouth every 6 (six) hours., Disp: 65 tablet, Rfl: 1 .  ferrous sulfate 325 (65 FE) MG EC tablet, Take 325 mg by mouth daily with breakfast., Disp: , Rfl:  .  sertraline (ZOLOFT) 100 MG tablet, Take 50 mg by mouth daily. Take 25mg  twice a day with food, Disp: , Rfl:   Review of Systems  Constitutional: Negative for appetite change, chills, fatigue and fever.  Respiratory: Negative for chest tightness and shortness of breath.   Cardiovascular: Negative for chest pain and palpitations.  Gastrointestinal: Negative for abdominal pain, nausea and vomiting.  Neurological: Negative for dizziness and weakness.    Social History   Tobacco Use  . Smoking status: Former Smoker    Packs/day: 0.50    Types: Cigarettes    Last attempt to quit: 04/04/2016    Years since quitting: 2.0  . Smokeless tobacco: Never Used  Substance Use Topics  . Alcohol use: No      Objective:   BP 98/60 (BP Location: Right Arm, Patient Position: Sitting, Cuff Size: Large)   Pulse 79   Temp 98.4 F (36.9 C) (Oral)   Resp 16   Wt 177 lb (80.3 kg)   SpO2 99%   BMI 25.40 kg/m  Vitals:   04/04/18 0926  BP: 98/60  Pulse: 79  Resp: 16  Temp: 98.4 F (36.9 C)  TempSrc: Oral  SpO2: 99%  Weight: 177 lb (80.3 kg)     Physical Exam Exam conducted with a chaperone present.  Constitutional:      Appearance: Normal appearance.    Genitourinary:    Cervix: Normal.     Comments: IUD strings present. Cervix is located towards examiners right.  Skin:    General: Skin is warm and dry.     Comments: Multiple cystic acne lesions on face.   Neurological:     Mental Status: She is alert and oriented to person, place, and time. Mental status is at baseline.  Psychiatric:        Mood and Affect: Mood normal.        Behavior: Behavior normal.         Assessment & Plan    1. Cervical cancer screening  Send off PAP smear.   - Cytology - PAP  2. Cystic acne  Stop spironolactone.   - Ambulatory referral to Dermatology  The entirety of the information documented in the History of Present Illness, Review of Systems and Physical Exam were personally obtained by me. Portions of this information were initially documented by April miller, CMA and reviewed by me for thoroughness and accuracy.    F/u 1 year.          Trey Sailors, PA-C  [facility]

## 2018-04-06 ENCOUNTER — Telehealth: Payer: Self-pay

## 2018-04-06 LAB — CYTOLOGY - PAP
Diagnosis: NEGATIVE
HPV: NOT DETECTED

## 2018-04-06 NOTE — Telephone Encounter (Signed)
No answer

## 2018-04-06 NOTE — Telephone Encounter (Signed)
-----   Message from Trey Sailors, [place] sent at 04/06/2018 12:45 PM EDT ----- Normal pap, negative HPV repeat 5 years.

## 2018-04-09 NOTE — Telephone Encounter (Signed)
Advised as directed below.  Thanks,  -Joseline

## 2018-04-09 NOTE — Telephone Encounter (Signed)
No answer; mailbox not set up yet.

## 2018-06-19 ENCOUNTER — Other Ambulatory Visit: Payer: Self-pay

## 2018-06-19 ENCOUNTER — Telehealth: Payer: Self-pay | Admitting: Physician Assistant

## 2018-06-19 MED ORDER — FLUTICASONE PROPIONATE 50 MCG/ACT NA SUSP: 2.0000 | Freq: Every day | NASAL | 6 refills | Status: AC

## 2018-06-19 NOTE — Telephone Encounter (Signed)
Pt needs refill on her   Fluticasone propionate nasal spray  [facility] [address]

## 2018-07-09 ENCOUNTER — Other Ambulatory Visit: Payer: Self-pay

## 2018-07-09 DIAGNOSIS — F32A Depression, unspecified: Secondary | ICD-10-CM

## 2018-07-09 DIAGNOSIS — F329 Major depressive disorder, single episode, unspecified: Secondary | ICD-10-CM

## 2018-07-09 MED ORDER — SERTRALINE HCL 100 MG PO TABS: 50.0000 mg | ORAL_TABLET | Freq: Every day | ORAL | 1 refills | Status: AC

## 2018-07-10 ENCOUNTER — Telehealth: Payer: Self-pay | Admitting: Physician Assistant

## 2018-07-10 NOTE — Telephone Encounter (Signed)
°  Estill Bamberg with [facility] at [phone]  Needing clarification on the directions of: sertraline (ZOLOFT) 100 MG tablet There were 2 different instructions.  Please advise.  Thanks, [employer]

## 2018-07-10 NOTE — Telephone Encounter (Signed)
Called [facility] and clarified directions. Sertraline 50 mg BID.

## 2018-07-11 NOTE — Telephone Encounter (Signed)
Pt called to check on refill of sertraline (ZOLOFT) 100 MG tablet Please send her Rx to: [id number] [address], [place] [phone] (Phone) [phone] (Fax)   Does not use [facility] now.  Thanks, [employer]

## 2018-10-11 IMAGING — US US MFM OB DETAIL+14 WK
1 series · 13 of 28 positions shown · non-contrast
Comparison: none

PATIENT INFO:

PERFORMED BY:
SERVICE(S) PROVIDED:
INDICATIONS:
18 weeks gestation of pregnancy
FETAL EVALUATION:
Num Of Fetuses:     1
Fetal Heart         160
Rate(bpm):
Cardiac Activity:   Present
Presentation:       Vertex
Placenta:           Anterior Grade 1, No previa
Largest Pocket(cm)
4.29
BIOMETRY:
BPD:      38.6  mm     G. Age:  17w 5d         17  %    CI:        74.73   %    70 - 86
FL/HC:       18.6  %    16.1 -
HC:      141.7  mm     G. Age:  17w 3d          5  %
FL/BPD:      68.1  %
FL:       26.3  mm     G. Age:  18w 0d         24  %
HUM:      26.5  mm     G. Age:  18w 2d         48  %
GESTATIONAL AGE:
LMP:           18w 4d        Date:  02/11/16                 EDD:   11/17/16
U/S Today:     17w 5d                                        EDD:   11/23/16
Best:          18w 4d     Det. By:  LMP  (02/11/16)          EDD:   11/17/16
ANATOMY:
Cranium:               Within Normal Limits   Aortic Arch:            Normal appearance
Cavum:                 CSP visualized         Ductal Arch:            Normal appearance
Ventricles:            Normal appearance      Diaphragm:              Within Normal Limits
Choroid Plexus:        Within Normal Limits   Stomach:                Seen
Cerebellum:            Within Normal Limits   Abdomen:                Within Normal
Limits
Posterior Fossa:       Within Normal Limits   Abdominal Wall:         Normal appearance
Nuchal Fold:           Within Normal Limits   Cord Vessels:           3 vessels
Face:                  Orbits visualized      Kidneys:                Normal appearance
Lips:                  Normal appearance      Bladder:                Seen
Thoracic:              Within Normal Limits   Spine:                  Normal appearance
Heart:                 4-Chamber view         Upper Extremities:      Visualized
appears normal
RVOT:                  Normal appearance      Lower Extremities:      Visualized
LVOT:                  Normal appearance
CERVIX UTERUS ADNEXA:
Cervix
Length:            3.7  cm.
Left Ovary
Size(cm)     3.21   x   2.08   x  18.8      Vol(ml):
Comment:      Right ovary was not visualized.

[Series 1: us mfm ob detail+14 wk · 0.19mm/px · 80 acquisitions, 13 frames shown]
[im 3/80]
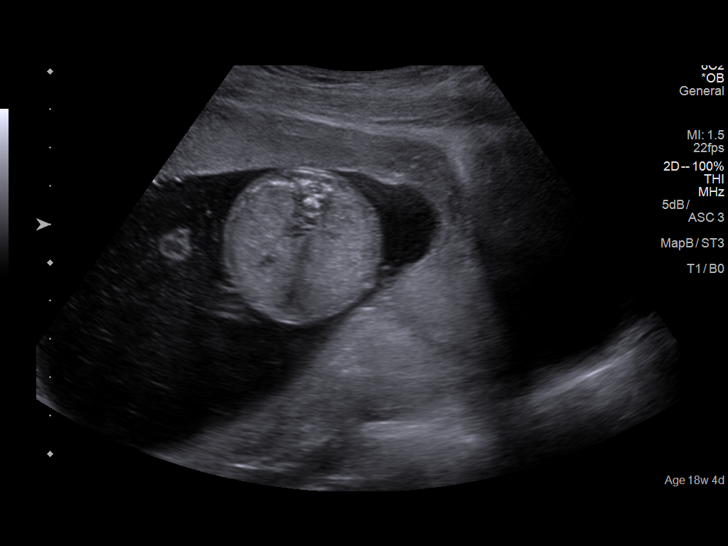
[im 9/80]
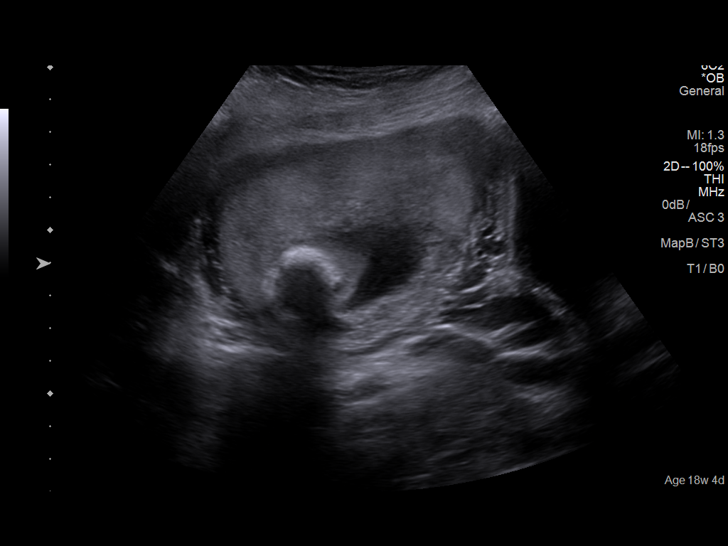
[im 15/80]
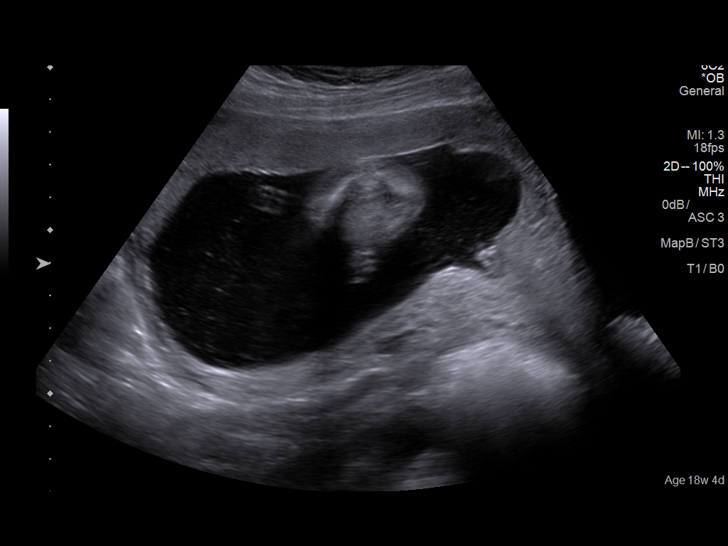
[im 21/80]
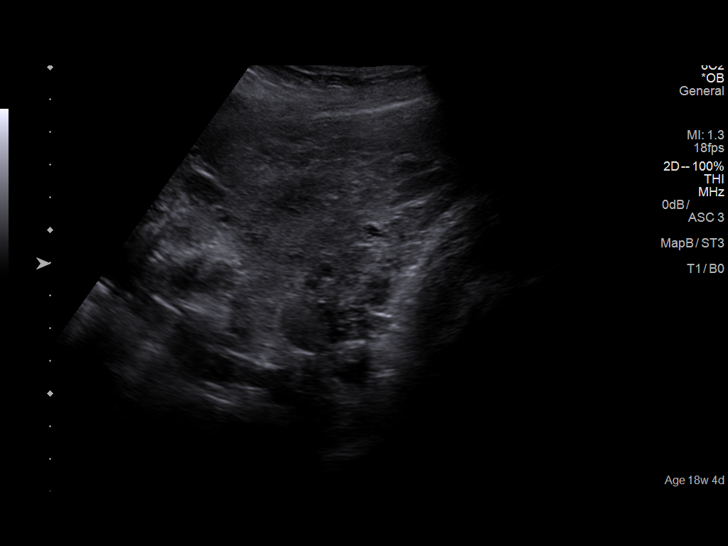
[im 27/80]
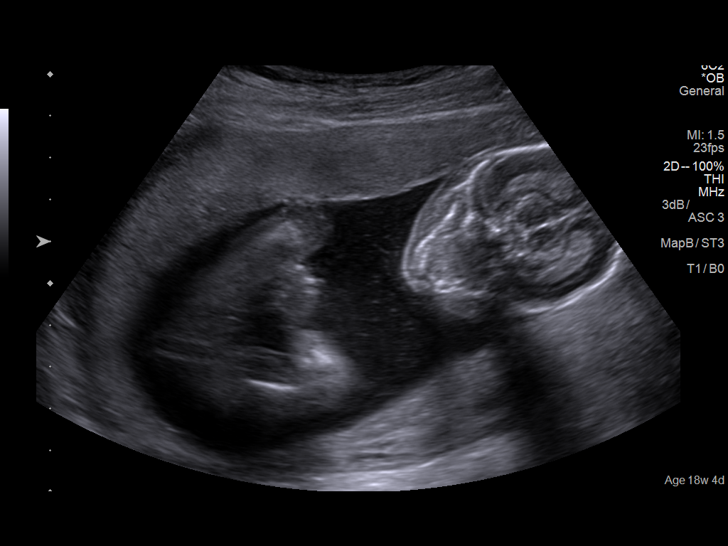
[im 33/80]
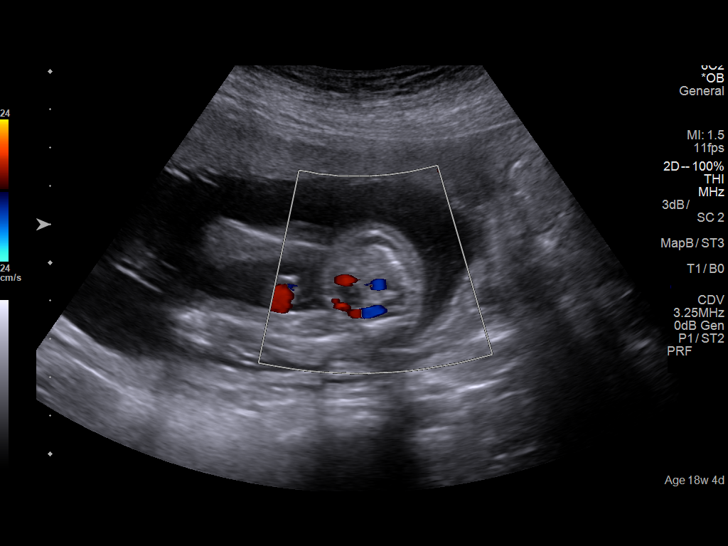
[im 41/80]
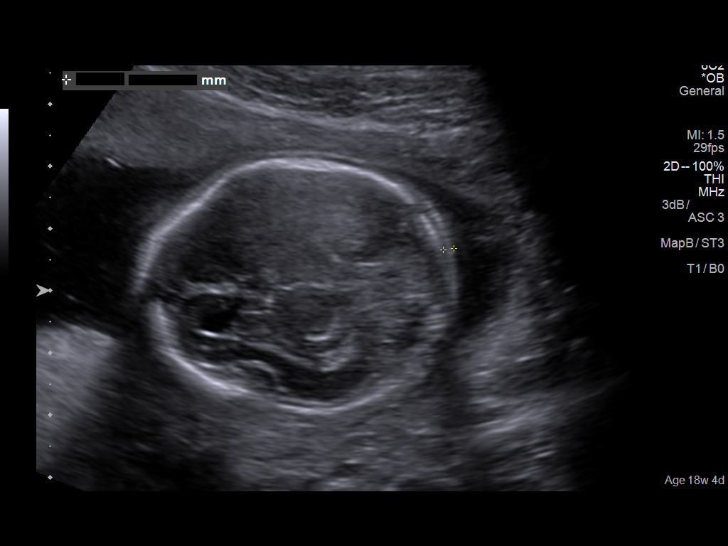
[im 47/80]
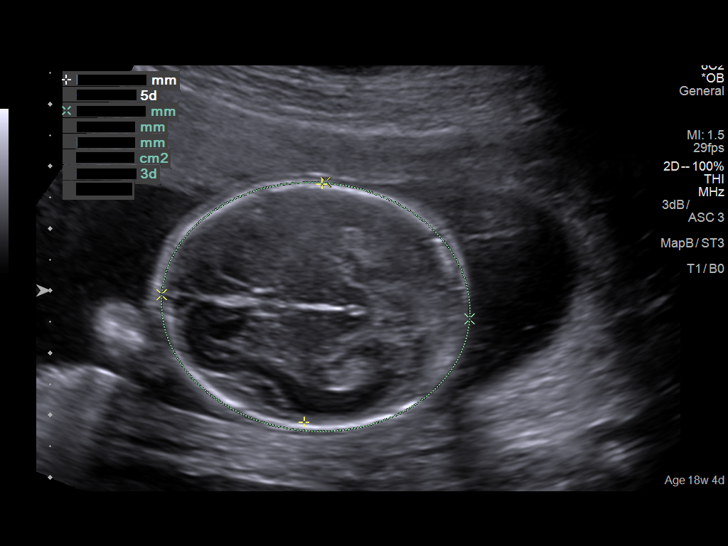
[im 53/80]
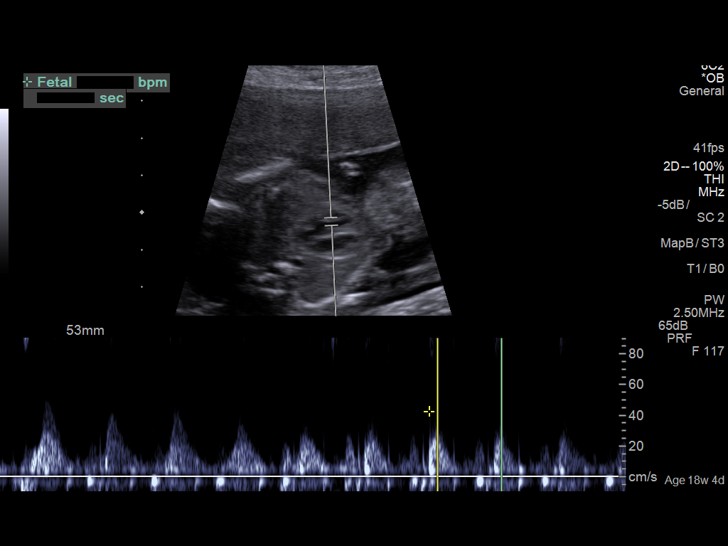
[im 59/80]
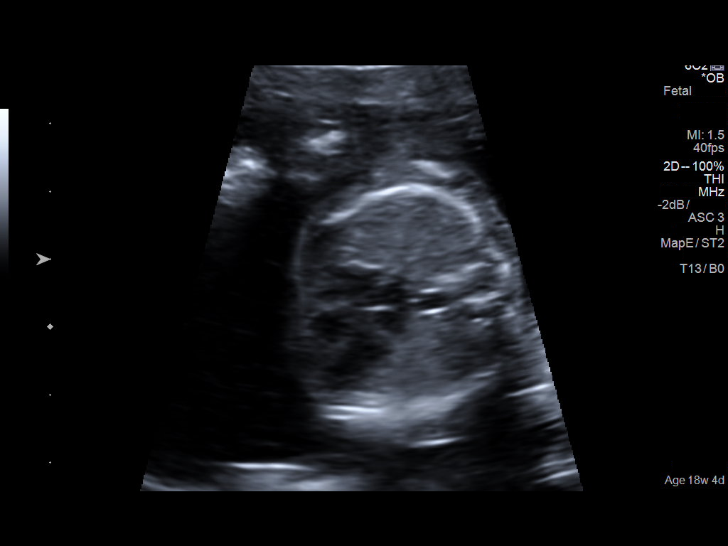
[im 65/80]
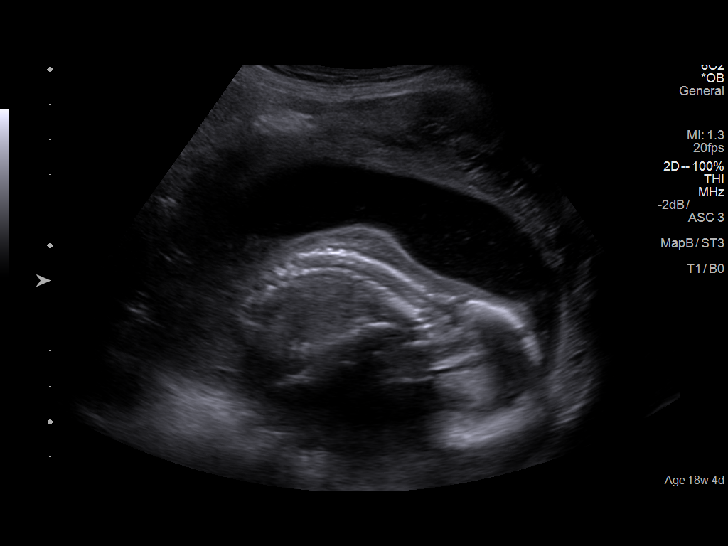
[im 71/80]
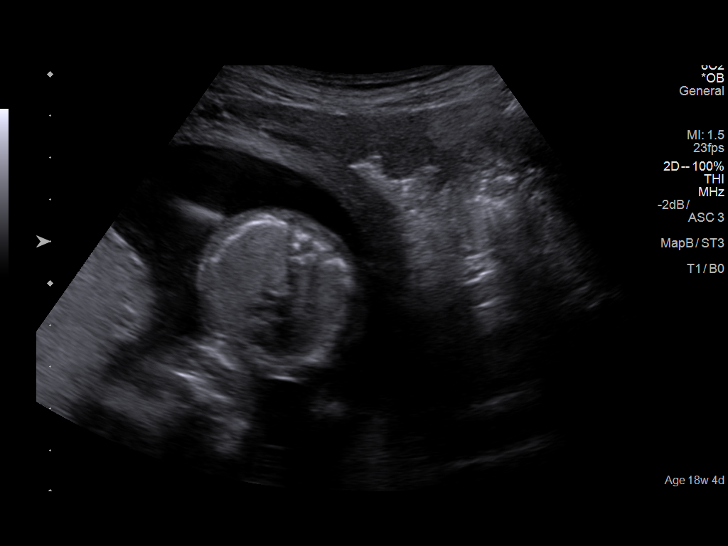
[im 77/80]
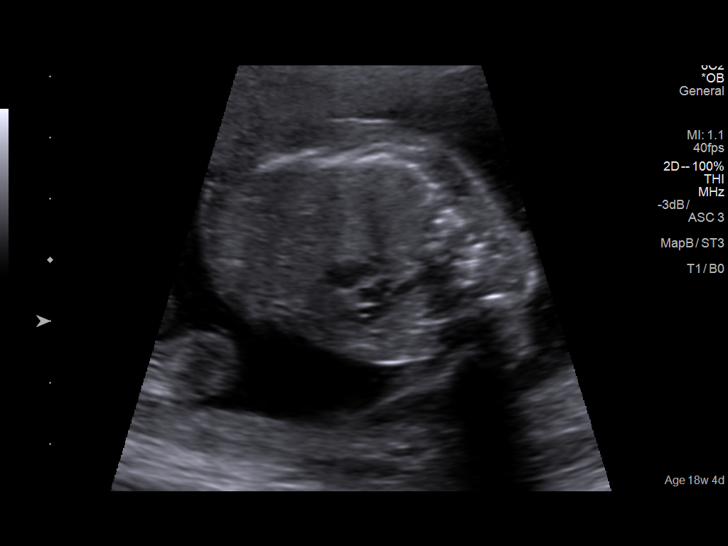

[13 of 28 positions shown; findings below may reference images not displayed]

IMPRESSION: Dear Ms.  REARDON,

Thank you for referring your patient to [facility] for a
fetal anatomical survey.  The patient was originally referred
for first trimester screening which was normal.

Today, there is a singleton gestation at 84w9d by LMP which
was consistent with 68w6d ultrasound performed here at
[facility] on 05/09/2016.

The fetal biometry correlates with established dating.

Detailed evaluation of the fetal anatomy was performed.  The
fetal anatomical survey appears within normal limits.

Thank you for allowing us to participate in your patient's care.

assistance.

Del Jumper

## 2018-10-15 ENCOUNTER — Telehealth: Payer: Self-pay

## 2018-10-15 NOTE — Telephone Encounter (Signed)
Patient c/o pain in right side of pelvic area. Patient reports that yesterday she had some bleeding yesterday. Patient denies any lower back pain, painful urination or burning. Patient denies any constipation or diarrhea. Patient advised to go to the emergency room if symptoms get worse tonight. Patient scheduled for office visit tomorrow at 8 am.

## 2018-10-16 ENCOUNTER — Ambulatory Visit: Payer: Self-pay | Admitting: Physician Assistant

## 2018-11-07 ENCOUNTER — Other Ambulatory Visit: Payer: Self-pay | Admitting: Physician Assistant

## 2018-11-07 ENCOUNTER — Other Ambulatory Visit: Payer: Self-pay

## 2018-11-07 ENCOUNTER — Ambulatory Visit: Payer: Medicaid Other | Admitting: Physician Assistant

## 2018-11-07 ENCOUNTER — Emergency Department
Admission: EM | Admit: 2018-11-07 | Discharge: 2018-11-07 | Disposition: A | Discharge status: Home / Self Care | Payer: Medicaid Other | Attending: Emergency Medicine | Admitting: Emergency Medicine

## 2018-11-07 DIAGNOSIS — N643 Galactorrhea not associated with childbirth: Secondary | ICD-10-CM

## 2018-11-07 DIAGNOSIS — R109 Unspecified abdominal pain: Secondary | ICD-10-CM | POA: Diagnosis present

## 2018-11-07 DIAGNOSIS — R1013 Epigastric pain: Secondary | ICD-10-CM | POA: Diagnosis not present

## 2018-11-07 DIAGNOSIS — N6452 Nipple discharge: Secondary | ICD-10-CM | POA: Diagnosis not present

## 2018-11-07 DIAGNOSIS — Z87891 Personal history of nicotine dependence: Secondary | ICD-10-CM | POA: Insufficient documentation

## 2018-11-07 LAB — CBC
HCT: 38.8 % (ref 36.0–46.0)
Hemoglobin: 13.1 g/dL (ref 12.0–15.0)
MCH: 30.9 pg (ref 26.0–34.0)
MCHC: 33.8 g/dL (ref 30.0–36.0)
MCV: 91.5 fL (ref 80.0–100.0)
Platelets: 285 10*3/uL (ref 150–400)
RBC: 4.24 MIL/uL (ref 3.87–5.11)
RDW: 12.3 % (ref 11.5–15.5)
WBC: 7.3 10*3/uL (ref 4.0–10.5)
nRBC: 0 % (ref 0.0–0.2)

## 2018-11-07 LAB — COMPREHENSIVE METABOLIC PANEL
ALT: 16 U/L (ref 0–44)
AST: 19 U/L (ref 15–41)
Albumin: 4.2 g/dL (ref 3.5–5.0)
Alkaline Phosphatase: 68 U/L (ref 38–126)
Anion gap: 10 (ref 5–15)
BUN: 9 mg/dL (ref 6–20)
CO2: 25 mmol/L (ref 22–32)
Calcium: 9.3 mg/dL (ref 8.9–10.3)
Chloride: 103 mmol/L (ref 98–111)
Creatinine, Ser: 0.72 mg/dL (ref 0.44–1.00)
GFR calc Af Amer: >60 mL/min (ref >60)
GFR calc non Af Amer: >60 mL/min (ref >60)
Glucose, Bld: 97 mg/dL (ref 70–99)
Potassium: 3.6 mmol/L (ref 3.5–5.1)
Sodium: 138 mmol/L (ref 135–145)
Total Bilirubin: 0.5 mg/dL (ref 0.3–1.2)
Total Protein: 7.9 g/dL (ref 6.5–8.1)

## 2018-11-07 LAB — URINALYSIS, COMPLETE (UACMP) WITH MICROSCOPIC
Bilirubin Urine: NEGATIVE
Glucose, UA: NEGATIVE mg/dL
Hgb urine dipstick: NEGATIVE
Ketones, ur: NEGATIVE mg/dL
Leukocytes,Ua: NEGATIVE
Nitrite: NEGATIVE
Protein, ur: NEGATIVE mg/dL
Specific Gravity, Urine: 1.005 (ref 1.005–1.030)
pH: 6 (ref 5.0–8.0)

## 2018-11-07 LAB — LIPASE, BLOOD: Lipase: 21 U/L (ref 11–51)

## 2018-11-07 LAB — POCT PREGNANCY, URINE: Preg Test, Ur: NEGATIVE

## 2018-11-07 MED ORDER — ALUM & MAG HYDROXIDE-SIMETH 200-200-20 MG/5ML PO SUSP
30.0000 mL | Freq: Once | ORAL | Status: AC
  Administered 2018-11-07: 30 mL via ORAL
  Filled 2018-11-07: qty 30

## 2018-11-07 MED ORDER — SUCRALFATE 1 G PO TABS: 1.0000 g | ORAL_TABLET | Freq: Four times a day (QID) | ORAL | 0 refills | Status: AC

## 2018-11-07 MED ORDER — FAMOTIDINE 20 MG PO TABS: 20.0000 mg | ORAL_TABLET | Freq: Two times a day (BID) | ORAL | 1 refills | Status: AC

## 2018-11-07 MED ORDER — SODIUM CHLORIDE 0.9% FLUSH: 3.0000 mL | Freq: Once | INTRAVENOUS | Status: DC

## 2018-11-07 MED ORDER — LIDOCAINE VISCOUS HCL 2 % MT SOLN
15.0000 mL | Freq: Once | OROMUCOSAL | Status: AC
  Administered 2018-11-07: 15 mL via ORAL
  Filled 2018-11-07: qty 15

## 2018-11-07 NOTE — ED Triage Notes (Signed)
Pt comes via POV from home with c/o abdominal pain and possible pregnancy. Pt states that she has noticed her belly getting bigger. Pt also states she noticed she was lactating. Pt states she took couple of home preg test and they were negative. Pt state something is going on and she doesn't know what.  Pt denies any N/V/D. Pt denies any constipation.

## 2018-11-07 NOTE — ED Notes (Signed)
Pt reports she has been gaining weight and has had milky discharge from her breast this week.  Pt also reports upper abd pain.  No vaginal bleeding or discharge.  No urinary sx.  Pt alert  Speech clear.

## 2018-11-07 NOTE — Patient Instructions (Signed)
Galactorrhea Galactorrhea is the flow of a milky fluid (discharge) from the breast. It is different from normal milk in nursing mothers. The fluid can be white, yellow, or green. This condition can be caused by many things. Most cases are not serious and do not require treatment. Watch your condition to make sure it goes away. Follow these instructions at home: Breast care   Watch your condition for any changes.  Do not squeeze your breasts or nipples.  Avoid touching your breasts when you are having sex.  Perform a breast self-exam once a month.  Avoid clothes that rub on your nipples.  Use breast pads to absorb the fluid.  Wear a support bra or a breast binder. General instructions  Take over-the-counter and prescription medicines only as told by your doctor.  Keep all follow-up visits as told by your doctor. This is important. Contact a doctor if you:  Have hot flashes.  Have vaginal dryness.  Have no desire for sex.  Stop having periods, or have periods that are irregular or far apart.  Have headaches.  Cannot see well. Get help right away if:  Your breast discharge is bloody or yellowish white (puslike).  You have breast pain.  You feel a lump in your breast.  Your breast shows wrinkling or dimpling.  Your breast becomes red and swollen. Summary  Galactorrhea is the flow of a milky fluid (discharge) from the breast.  This condition may be caused by many things, but it is not usually serious.  Watch your condition carefully to make sure that it goes away.  Get help right away if the fluid is bloody or yellowish white, or if you have a lump, pain, or skin changes on your breast. This information is not intended to replace advice given to you by your health care provider. Make sure you discuss any questions you have with your health care provider. Document Released: 02/05/2010 Document Revised: 01/16/2017 Document Reviewed: 01/16/2017 Elsevier Patient  Education  2020 Elsevier Inc.  

## 2018-11-07 NOTE — Discharge Instructions (Addendum)
As we discussed please continue to work with your primary care provider to work up your breast discharge. Please seek medical attention for any high fevers, chest pain, shortness of breath, change in behavior, persistent vomiting, bloody stool or any other new or concerning symptoms.

## 2018-11-07 NOTE — ED Provider Notes (Signed)
[facility] Emergency Department Provider Note   ____________________________________________   I have reviewed the triage vital signs and the nursing notes.   HISTORY  Chief Complaint Abdominal Pain   History limited by: Not Limited   HPI Kelli Ramirez is a 28 y.o. female who presents to the emergency department today with primary concern for abdominal pain.  She states in the epigastric region.  She says that it started today.  She had seen her primary care doctor earlier today because of concerns for abnormal breast discharge.  Per chart review the primary care doctor has started work-up for galactorrhea.  The patient states that she also feels like she has had weight gain recently.  She has taken multiple pregnancy tests at home which were negative.  Records reviewed. Per medical record review patient has a history of visit to PCP earlier today for abnormal breast discharge.   Past Medical History:  Diagnosis Date  . Anemia    takes iron supplements  . Depression   . GERD (gastroesophageal reflux disease)    with pregnancy  . Medical history non-contributory   . Pregnancy    edc 11/17/2016.  c/section planned for 11/10/2016    Patient Active Problem List   Diagnosis Date Noted  . Postoperative state 11/10/2016  . First trimester screening     Past Surgical History:  Procedure Laterality Date  . CESAREAN SECTION  2009  . CESAREAN SECTION WITH BILATERAL TUBAL LIGATION Bilateral 11/10/2016   Procedure: CESAREAN SECTION WITH BILATERAL TUBAL LIGATION;  Surgeon: Boykin Nearing, MD;  Location: [facility] ORS;  Service: Obstetrics;  Laterality: Bilateral;  birth 27    Prior to Admission medications   Medication Sig Start Date End Date Taking? Authorizing Provider  fluticasone (FLONASE) 50 MCG/ACT nasal spray Place 2 sprays into both nostrils daily. 06/19/18   Trinna Post, PA-C  ibuprofen (ADVIL,MOTRIN) 600 MG tablet Take 1 tablet (600 mg  total) by mouth every 6 (six) hours. 11/13/16   Ward, Honor Loh, MD  sertraline (ZOLOFT) 100 MG tablet Take 0.5 tablets (50 mg total) by mouth daily. Take 25mg  twice a day with food 07/09/18 01/05/19  Trinna Post, PA-C    Allergies Patient has no known allergies.  Family History  Problem Relation Age of Onset  . Diabetes Maternal Uncle   . Hypertension Paternal Aunt   . Thyroid disease Paternal Uncle   . Breast cancer Maternal Grandmother   . Hypertension Maternal Grandmother   . Hypertension Maternal Grandfather     Social History Social History   Tobacco Use  . Smoking status: Former Smoker    Packs/day: 0.50    Types: Cigarettes    Quit date: 04/04/2016    Years since quitting: 2.5  . Smokeless tobacco: Never Used  Substance Use Topics  . Alcohol use: No  . Drug use: No    Review of Systems Constitutional: No fever/chills. Positive for weight gain.  Eyes: No visual changes. ENT: No sore throat. Cardiovascular: Denies chest pain. Respiratory: Denies shortness of breath. Gastrointestinal: Positive for abdominal pain.  Genitourinary: Negative for dysuria. Musculoskeletal: Negative for back pain. Skin: Negative for rash. Neurological: Negative for headaches, focal weakness or numbness.  ____________________________________________   PHYSICAL EXAM:  VITAL SIGNS: ED Triage Vitals [11/07/18 1847]  Enc Vitals Group     BP (!) 137/97     Pulse Rate (!) 110     Resp 18     Temp 98.4 F (36.9 C)  Temp src      SpO2 100 %     Weight 180 lb (81.6 kg)     Height 5\' 11"  (1.803 m)     Head Circumference      Peak Flow      Pain Score 10   Constitutional: Alert and oriented.  Eyes: Conjunctivae are normal.  ENT      Head: Normocephalic and atraumatic.      Nose: No congestion/rhinnorhea.      Mouth/Throat: Mucous membranes are moist.      Neck: No stridor. Hematological/Lymphatic/Immunilogical: No cervical lymphadenopathy. Cardiovascular: Normal rate,  regular rhythm.  No murmurs, rubs, or gallops.  Respiratory: Normal respiratory effort without tachypnea nor retractions. Breath sounds are clear and equal bilaterally. No wheezes/rales/rhonchi. Gastrointestinal: Soft and tender in the upper abdomen, worse on the left side.  Genitourinary: Deferred Musculoskeletal: Normal range of motion in all extremities. No lower extremity edema. Neurologic:  Normal speech and language. No gross focal neurologic deficits are appreciated.  Skin:  Skin is warm, dry and intact. No rash noted. Psychiatric: Mood and affect are normal. Speech and behavior are normal. Patient exhibits appropriate insight and judgment.  ____________________________________________    LABS (pertinent positives/negatives)  Upreg negative CMP wnl UA hazy, rare bacteria, otherwise unremarkable Lipase 21 CBC wbc 7.3, hgb 13.1, plt 285 ____________________________________________   EKG  None  ____________________________________________    RADIOLOGY  None  ____________________________________________   PROCEDURES  Procedures  ____________________________________________   INITIAL IMPRESSION / ASSESSMENT AND PLAN / ED COURSE  Pertinent labs & imaging results that were available during my care of the patient were reviewed by me and considered in my medical decision making (see chart for details).   Patient presented to the emergency department today because of concerns for abdominal pain.  Located in the epigastric region.  On exam patient did have some tenderness in the upper abdomen worse on the left side.  The patient also had complaints of abnormal breast discharge.  Does appear that the patient was getting work-up through primary care physician for this.  Blood work here without any concerning leukocytosis, elevated lipase or lfts.  Patient stated she did feel some relief with GI cocktail.  At this point I do wonder if patient suffering from gastritis.  Will  prescribe medications for gastritis.  Discussed with patient she should continue work-up with primary care.  ____________________________________________   FINAL CLINICAL IMPRESSION(S) / ED DIAGNOSES  Final diagnoses:  Epigastric pain  Discharge from breast     Note: This dictation was prepared with Dragon dictation. Any transcriptional errors that result from this process are unintentional     , MD 11/07/18 2148

## 2018-11-07 NOTE — Progress Notes (Signed)
Patient: Kelli Ramirez Female    DOB: Jan 24, 1990   28 y.o.   MRN: [id number] Visit Date: 11/07/2018  Today's Provider: Trinna Post, PA-C   Chief Complaint  Patient presents with  . Weight Gain   Subjective:     HPI Patient here today c/o abnormal weight gain, increased appetite, and abnormal discharge from breast. Patient reports she had tubal ligation and had a IUD, which she now she reports she has not been able to feel string. Patient reports she did take three pregnancy test and all came back negative. Patient reports that partner was sucking on breasts and she noticed the discharge at this point. Afterwards she was able to express discharge from her breasts. She reports a 10 lb weight gain and abdominal bloating. Discharge is nonbloody. She denies nausea, vomiting, vaginal bleeding. She denies vision change, balance issues.   Wt Readings from Last 3 Encounters:  04/04/18 177 lb (80.3 kg)  03/07/18 168 lb 9.6 oz (76.5 kg)  02/01/18 175 lb (79.4 kg)    No Known Allergies   Current Outpatient Medications:  .  fluticasone (FLONASE) 50 MCG/ACT nasal spray, Place 2 sprays into both nostrils daily., Disp: 16 g, Rfl: 6 .  ibuprofen (ADVIL,MOTRIN) 600 MG tablet, Take 1 tablet (600 mg total) by mouth every 6 (six) hours., Disp: 65 tablet, Rfl: 1 .  sertraline (ZOLOFT) 100 MG tablet, Take 0.5 tablets (50 mg total) by mouth daily. Take 25mg  twice a day with food, Disp: 90 tablet, Rfl: 1  Review of Systems  Constitutional: Positive for appetite change and unexpected weight change.  Psychiatric/Behavioral: The patient is nervous/anxious.     Social History   Tobacco Use  . Smoking status: Former Smoker    Packs/day: 0.50    Types: Cigarettes    Quit date: 04/04/2016    Years since quitting: 2.5  . Smokeless tobacco: Never Used  Substance Use Topics  . Alcohol use: No      Objective:   There were no vitals taken for this visit. There were no vitals filed for  this visit.There is no height or weight on file to calculate BMI.   Physical Exam Constitutional:      Appearance: Normal appearance.  Cardiovascular:     Pulses: Normal pulses.  Pulmonary:     Effort: Pulmonary effort is normal.  Chest:     Chest wall: No mass.     Breasts:        Right: Normal. No inverted nipple, mass or nipple discharge.        Left: Normal. No inverted nipple, mass or nipple discharge.  Genitourinary:    Cervix: Normal.     Comments: Cervix is normal appearing and IUD strings are present.  Skin:    General: Skin is warm and dry.  Neurological:     Mental Status: She is alert and oriented to person, place, and time. Mental status is at baseline.  Psychiatric:        Mood and Affect: Mood normal.        Behavior: Behavior normal.      No results found for any visits on 11/07/18.     Assessment & Plan    1. Galactorrhea  Breast exam is benign. Patient has had tubal ligation and IUD strings are visualized on exam. I do not suspect patient is pregnant. I suspect this may be due to stimulation but cannot rule out pituitary adenoma. Will start with  labs as below. Counseled patient that prolactin will likely be high and we will need to repeat lab after there has been no stimulation. May need to pursue MRI if prolactin is persistently high.   - Beta HCG, Quant - Prolactin - Comprehensive Metabolic Panel (CMET) - TSH      Trey Sailors, PA-C  [facility]

## 2018-11-08 ENCOUNTER — Telehealth: Payer: Self-pay

## 2018-11-08 LAB — COMPREHENSIVE METABOLIC PANEL
ALT: 10 IU/L (ref 0–32)
AST: 12 IU/L (ref 0–40)
Albumin/Globulin Ratio: 1.7 (ref 1.2–2.2)
Albumin: 4.3 g/dL (ref 3.9–5.0)
Alkaline Phosphatase: 88 IU/L (ref 39–117)
BUN/Creatinine Ratio: 10 (ref 9–23)
BUN: 8 mg/dL (ref 6–20)
Bilirubin Total: <0.2 mg/dL (ref 0.0–1.2)
CO2: 22 mmol/L (ref 20–29)
Calcium: 9.5 mg/dL (ref 8.7–10.2)
Chloride: 103 mmol/L (ref 96–106)
Creatinine, Ser: 0.79 mg/dL (ref 0.57–1.00)
GFR calc Af Amer: 116 mL/min/{1.73_m2} (ref >59)
GFR calc non Af Amer: 101 mL/min/{1.73_m2} (ref >59)
Globulin, Total: 2.6 g/dL (ref 1.5–4.5)
Glucose: 102 mg/dL — ABNORMAL HIGH (ref 65–99)
Potassium: 4.4 mmol/L (ref 3.5–5.2)
Sodium: 141 mmol/L (ref 134–144)
Total Protein: 6.9 g/dL (ref 6.0–8.5)

## 2018-11-08 LAB — PROLACTIN: Prolactin: 51.7 ng/mL — ABNORMAL HIGH (ref 4.8–23.3)

## 2018-11-08 LAB — TSH: TSH: 2.28 u[IU]/mL (ref 0.450–4.500)

## 2018-11-08 LAB — BETA HCG QUANT (REF LAB): hCG Quant: <1 m[IU]/mL

## 2018-11-08 NOTE — Telephone Encounter (Signed)
-----   Message from Trinna Post, [place] sent at 11/08/2018  8:29 AM EDT ----- Pregnancy test is negative. Her prolactin is high which is a hormone associated with milk production. She will need to repeat this in 2 weeks and not have any nipple stimulation in that time. Please place order for prolactin, she can get this on walk in basis.

## 2018-11-08 NOTE — Progress Notes (Signed)
No, thank you for asking.

## 2018-11-08 NOTE — Telephone Encounter (Signed)
Patient advised as below. Patient verbalizes understanding and is in agreement with treatment plan.  

## 2018-11-08 NOTE — Telephone Encounter (Signed)
Pt returned missed call.  Please call pt back. ° °Thanks, °TGH °

## 2018-11-08 NOTE — Telephone Encounter (Signed)
lmtcb

## 2018-11-08 NOTE — Telephone Encounter (Signed)
Tried calling patient. Left message to call back. 

## 2018-11-28 DIAGNOSIS — Z20828 Contact with and (suspected) exposure to other viral communicable diseases: Secondary | ICD-10-CM | POA: Diagnosis not present

## 2018-11-28 DIAGNOSIS — R05 Cough: Secondary | ICD-10-CM | POA: Diagnosis not present

## 2018-12-11 DIAGNOSIS — Z20828 Contact with and (suspected) exposure to other viral communicable diseases: Secondary | ICD-10-CM | POA: Diagnosis not present

## 2018-12-11 DIAGNOSIS — R05 Cough: Secondary | ICD-10-CM | POA: Diagnosis not present

## 2018-12-17 DIAGNOSIS — Z20828 Contact with and (suspected) exposure to other viral communicable diseases: Secondary | ICD-10-CM | POA: Diagnosis not present

## 2019-04-25 DIAGNOSIS — H52229 Regular astigmatism, unspecified eye: Secondary | ICD-10-CM | POA: Diagnosis not present

## 2019-04-25 DIAGNOSIS — H5213 Myopia, bilateral: Secondary | ICD-10-CM | POA: Diagnosis not present

## 2019-05-15 ENCOUNTER — Encounter: Payer: Medicaid Other | Admitting: Physician Assistant

## 2019-05-15 NOTE — Progress Notes (Deleted)
Complete physical exam   Patient: Kelli Ramirez   DOB: 13-Jul-1990   29 y.o. Female  MRN: [id number] Visit Date: 05/15/2019  Today's healthcare provider: Trinna Post, PA-C   No chief complaint on file.  Subjective    Kelli Ramirez is a 29 y.o. female who presents today for a complete physical exam.  She reports consuming a {diet types:17450} diet. {Exercise:19826} She generally feels {well/fairly well/poorly:18703}. She reports sleeping {well/fairly well/poorly:18703}. She {does/does not:200015} have additional problems to discuss today.  HPI  ***  Past Medical History:  Diagnosis Date  . Anemia    takes iron supplements  . Depression   . GERD (gastroesophageal reflux disease)    with pregnancy  . Medical history non-contributory   . Pregnancy    edc 11/17/2016.  c/section planned for 11/10/2016   Past Surgical History:  Procedure Laterality Date  . CESAREAN SECTION  2009  . CESAREAN SECTION WITH BILATERAL TUBAL LIGATION Bilateral 11/10/2016   Procedure: CESAREAN SECTION WITH BILATERAL TUBAL LIGATION;  Surgeon: Boykin Nearing, MD;  Location: [facility] ORS;  Service: Obstetrics;  Laterality: Bilateral;  birth 61   Social History   Socioeconomic History  . Marital status: Single    Spouse name: Not on file  . Number of children: Not on file  . Years of education: Not on file  . Highest education level: Not on file  Occupational History  . Not on file  Tobacco Use  . Smoking status: Former Smoker    Packs/day: 0.50    Types: Cigarettes    Quit date: 04/04/2016    Years since quitting: 3.1  . Smokeless tobacco: Never Used  Substance and Sexual Activity  . Alcohol use: No  . Drug use: No  . Sexual activity: Yes  Other Topics Concern  . Not on file  Social History Narrative  . Not on file   Social Determinants of Health   Financial Resource Strain:   . Difficulty of Paying Living Expenses:   Food Insecurity:   . Worried About Sales executive in the Last Year:   . Arboriculturist in the Last Year:   Transportation Needs:   . Film/video editor (Medical):   Marland Kitchen Lack of Transportation (Non-Medical):   Physical Activity:   . Days of Exercise per Week:   . Minutes of Exercise per Session:   Stress:   . Feeling of Stress :   Social Connections:   . Frequency of Communication with Friends and Family:   . Frequency of Social Gatherings with Friends and Family:   . Attends Religious Services:   . Active Member of Clubs or Organizations:   . Attends Archivist Meetings:   Marland Kitchen Marital Status:   Intimate Partner Violence:   . Fear of Current or Ex-Partner:   . Emotionally Abused:   Marland Kitchen Physically Abused:   . Sexually Abused:    Family Status  Relation Name Status  . Mat Uncle  (Not Specified)  . Ethlyn Daniels  (Not Specified)  . Annamarie Major  (Not Specified)  . MGM  (Not Specified)  . MGF  (Not Specified)   Family History  Problem Relation Age of Onset  . Diabetes Maternal Uncle   . Hypertension Paternal Aunt   . Thyroid disease Paternal Uncle   . Breast cancer Maternal Grandmother   . Hypertension Maternal Grandmother   . Hypertension Maternal Grandfather    No Known Allergies  Patient Care Team: Maryella Shivers as PCP - General (Physician Assistant)   Medications: Outpatient Medications Prior to Visit  Medication Sig  . famotidine (PEPCID) 20 MG tablet Take 1 tablet (20 mg total) by mouth 2 (two) times daily.  . fluticasone (FLONASE) 50 MCG/ACT nasal spray Place 2 sprays into both nostrils daily.  Marland Kitchen ibuprofen (ADVIL,MOTRIN) 600 MG tablet Take 1 tablet (600 mg total) by mouth every 6 (six) hours.  . sertraline (ZOLOFT) 100 MG tablet Take 0.5 tablets (50 mg total) by mouth daily. Take 25mg  twice a day with food  . sucralfate (CARAFATE) 1 g tablet Take 1 tablet (1 g total) by mouth 4 (four) times daily.   No facility-administered medications prior to visit.    Review of Systems  Constitutional:  Negative.   HENT: Negative.   Eyes: Negative.   Respiratory: Negative.   Cardiovascular: Negative.   Gastrointestinal: Negative.   Endocrine: Negative.   Genitourinary: Negative.   Musculoskeletal: Negative.   Skin: Negative.   Allergic/Immunologic: Negative.   Neurological: Negative.   Hematological: Negative.   Psychiatric/Behavioral: Negative.     {Show previous labs (optional):23779::" "}  Objective    There were no vitals taken for this visit. {Show previous vital signs (optional):23777::" "}  Physical Exam  ***  Depression Screen  PHQ 2/9 Scores 11/07/2018 01/03/2018  PHQ - 2 Score 1 1  PHQ- 9 Score 6 5    No results found for any visits on 05/15/19.  Assessment & Plan    Routine Health Maintenance and Physical Exam  Exercise Activities and Dietary recommendations Goals   None     Immunization History  Administered Date(s) Administered  . Hepatitis A, Adult 01/03/2018  . Influenza,inj,Quad PF,6+ Mos 01/03/2018  . Tdap 09/16/2016    Health Maintenance  Topic Date Due  . COVID-19 Vaccine (1) Never done  . INFLUENZA VACCINE  08/18/2019  . PAP SMEAR-Modifier  04/03/2021  . TETANUS/TDAP  09/17/2026  . HIV Screening  Completed    Discussed health benefits of physical activity, and encouraged her to engage in regular exercise appropriate for her age and condition.  ***  No follow-ups on file.     {provider attestation***:1}   09/19/2026  [facility] [phone] (phone) [phone] (fax)  [facility]

## 2019-05-16 NOTE — Progress Notes (Deleted)
Complete physical exam   Patient: Kelli Ramirez   DOB: [birth year]   29 y.o. Female  MRN: [id number] Visit Date: 05/16/2019  Today's healthcare provider: Trinna Post, PA-C   No chief complaint on file.  Subjective    Kelli Ramirez is a 29 y.o. female who presents today for a complete physical exam.  She reports consuming a {diet types:17450} diet. {Exercise:19826} She generally feels {well/fairly well/poorly:18703}. She reports sleeping {well/fairly well/poorly:18703}. She {does/does not:200015} have additional problems to discuss today.  HPI  ***  Past Medical History:  Diagnosis Date  . Anemia    takes iron supplements  . Depression   . GERD (gastroesophageal reflux disease)    with pregnancy  . Medical history non-contributory   . Pregnancy    edc 11/17/2016.  c/section planned for 11/10/2016   Past Surgical History:  Procedure Laterality Date  . CESAREAN SECTION  2009  . CESAREAN SECTION WITH BILATERAL TUBAL LIGATION Bilateral 11/10/2016   Procedure: CESAREAN SECTION WITH BILATERAL TUBAL LIGATION;  Surgeon: Boykin Nearing, MD;  Location: [facility] ORS;  Service: Obstetrics;  Laterality: Bilateral;  birth 79   Social History   Socioeconomic History  . Marital status: Single    Spouse name: Not on file  . Number of children: Not on file  . Years of education: Not on file  . Highest education level: Not on file  Occupational History  . Not on file  Tobacco Use  . Smoking status: Former Smoker    Packs/day: 0.50    Types: Cigarettes    Quit date: 04/04/2016    Years since quitting: 3.1  . Smokeless tobacco: Never Used  Substance and Sexual Activity  . Alcohol use: No  . Drug use: No  . Sexual activity: Yes  Other Topics Concern  . Not on file  Social History Narrative  . Not on file   Social Determinants of Health   Financial Resource Strain:   . Difficulty of Paying Living Expenses:   Food Insecurity:   . Worried About Sales executive in the Last Year:   . Arboriculturist in the Last Year:   Transportation Needs:   . Film/video editor (Medical):   Marland Kitchen Lack of Transportation (Non-Medical):   Physical Activity:   . Days of Exercise per Week:   . Minutes of Exercise per Session:   Stress:   . Feeling of Stress :   Social Connections:   . Frequency of Communication with Friends and Family:   . Frequency of Social Gatherings with Friends and Family:   . Attends Religious Services:   . Active Member of Clubs or Organizations:   . Attends Archivist Meetings:   Marland Kitchen Marital Status:   Intimate Partner Violence:   . Fear of Current or Ex-Partner:   . Emotionally Abused:   Marland Kitchen Physically Abused:   . Sexually Abused:    Family Status  Relation Name Status  . Mat Uncle  (Not Specified)  . Ethlyn Daniels  (Not Specified)  . Annamarie Major  (Not Specified)  . MGM  (Not Specified)  . MGF  (Not Specified)   Family History  Problem Relation Age of Onset  . Diabetes Maternal Uncle   . Hypertension Paternal Aunt   . Thyroid disease Paternal Uncle   . Breast cancer Maternal Grandmother   . Hypertension Maternal Grandmother   . Hypertension Maternal Grandfather    No Known Allergies  Patient Care Team: Maryella Shivers as PCP - General (Physician Assistant)   Medications: Outpatient Medications Prior to Visit  Medication Sig  . famotidine (PEPCID) 20 MG tablet Take 1 tablet (20 mg total) by mouth 2 (two) times daily.  . fluticasone (FLONASE) 50 MCG/ACT nasal spray Place 2 sprays into both nostrils daily.  Marland Kitchen ibuprofen (ADVIL,MOTRIN) 600 MG tablet Take 1 tablet (600 mg total) by mouth every 6 (six) hours.  . sertraline (ZOLOFT) 100 MG tablet Take 0.5 tablets (50 mg total) by mouth daily. Take 25mg  twice a day with food  . sucralfate (CARAFATE) 1 g tablet Take 1 tablet (1 g total) by mouth 4 (four) times daily.   No facility-administered medications prior to visit.    Review of Systems  {Show previous  labs (optional):23779::" "}  Objective    There were no vitals taken for this visit. {Show previous vital signs (optional):23777::" "}  Physical Exam  ***  Depression Screen  PHQ 2/9 Scores 11/07/2018 01/03/2018  PHQ - 2 Score 1 1  PHQ- 9 Score 6 5    No results found for any visits on 05/17/19.  Assessment & Plan    Routine Health Maintenance and Physical Exam  Exercise Activities and Dietary recommendations Goals   None     Immunization History  Administered Date(s) Administered  . Hepatitis A, Adult 01/03/2018  . Influenza,inj,Quad PF,6+ Mos 01/03/2018  . Tdap 09/16/2016    Health Maintenance  Topic Date Due  . COVID-19 Vaccine (1) Never done  . INFLUENZA VACCINE  08/18/2019  . PAP SMEAR-Modifier  04/03/2021  . TETANUS/TDAP  09/17/2026  . HIV Screening  Completed    Discussed health benefits of physical activity, and encouraged her to engage in regular exercise appropriate for her age and condition.  ***  No follow-ups on file.     {provider attestation***:1}   09/19/2026  [facility] [phone] (phone) [phone] (fax)  [facility]

## 2019-05-17 ENCOUNTER — Telehealth: Payer: Self-pay | Admitting: Physician Assistant

## 2019-05-17 ENCOUNTER — Encounter: Payer: Medicaid Other | Admitting: Physician Assistant

## 2019-05-21 ENCOUNTER — Encounter: Payer: Self-pay | Admitting: Physician Assistant

## 2019-12-18 DIAGNOSIS — L02415 Cutaneous abscess of right lower limb: Secondary | ICD-10-CM | POA: Diagnosis not present

## 2019-12-26 DIAGNOSIS — F331 Major depressive disorder, recurrent, moderate: Secondary | ICD-10-CM | POA: Diagnosis not present
